# Patient Record
Sex: Female | Born: 1970 | Race: Black or African American | Hispanic: No | Smoking: Never smoker
Health system: Southern US, Community
[De-identification: ages and names within clinical notes are randomized; demographics above are authoritative.]

## PROBLEM LIST (undated history)

## (undated) DIAGNOSIS — E7800 Pure hypercholesterolemia, unspecified: Secondary | ICD-10-CM

## (undated) DIAGNOSIS — D649 Anemia, unspecified: Secondary | ICD-10-CM

## (undated) DIAGNOSIS — Z8639 Personal history of other endocrine, nutritional and metabolic disease: Secondary | ICD-10-CM

## (undated) DIAGNOSIS — B9689 Other specified bacterial agents as the cause of diseases classified elsewhere: Secondary | ICD-10-CM

## (undated) DIAGNOSIS — B373 Candidiasis of vulva and vagina: Secondary | ICD-10-CM

## (undated) DIAGNOSIS — Z87898 Personal history of other specified conditions: Secondary | ICD-10-CM

## (undated) DIAGNOSIS — R300 Dysuria: Secondary | ICD-10-CM

## (undated) DIAGNOSIS — I493 Ventricular premature depolarization: Secondary | ICD-10-CM

## (undated) DIAGNOSIS — N926 Irregular menstruation, unspecified: Secondary | ICD-10-CM

## (undated) DIAGNOSIS — K219 Gastro-esophageal reflux disease without esophagitis: Secondary | ICD-10-CM

## (undated) DIAGNOSIS — Z87448 Personal history of other diseases of urinary system: Secondary | ICD-10-CM

## (undated) DIAGNOSIS — D250 Submucous leiomyoma of uterus: Secondary | ICD-10-CM

## (undated) DIAGNOSIS — Z87442 Personal history of urinary calculi: Secondary | ICD-10-CM

## (undated) DIAGNOSIS — L700 Acne vulgaris: Secondary | ICD-10-CM

## (undated) DIAGNOSIS — Z803 Family history of malignant neoplasm of breast: Secondary | ICD-10-CM

## (undated) DIAGNOSIS — L659 Nonscarring hair loss, unspecified: Secondary | ICD-10-CM

## (undated) DIAGNOSIS — L309 Dermatitis, unspecified: Secondary | ICD-10-CM

## (undated) DIAGNOSIS — E876 Hypokalemia: Secondary | ICD-10-CM

## (undated) DIAGNOSIS — E039 Hypothyroidism, unspecified: Secondary | ICD-10-CM

## (undated) DIAGNOSIS — G4700 Insomnia, unspecified: Secondary | ICD-10-CM

## (undated) DIAGNOSIS — D509 Iron deficiency anemia, unspecified: Secondary | ICD-10-CM

## (undated) DIAGNOSIS — Z8742 Personal history of other diseases of the female genital tract: Secondary | ICD-10-CM

## (undated) DIAGNOSIS — N760 Acute vaginitis: Secondary | ICD-10-CM

## (undated) DIAGNOSIS — K449 Diaphragmatic hernia without obstruction or gangrene: Secondary | ICD-10-CM

## (undated) HISTORY — DX: Acute vaginitis: N76.0

## (undated) HISTORY — DX: Dermatitis, unspecified: L30.9

## (undated) HISTORY — DX: Submucous leiomyoma of uterus: D25.0

## (undated) HISTORY — DX: Other specified bacterial agents as the cause of diseases classified elsewhere: B96.89

## (undated) HISTORY — DX: Pure hypercholesterolemia, unspecified: E78.00

## (undated) HISTORY — DX: Family history of malignant neoplasm of breast: Z80.3

## (undated) HISTORY — DX: Anemia, unspecified: D64.9

## (undated) HISTORY — DX: Iron deficiency anemia, unspecified: D50.9

## (undated) HISTORY — DX: Personal history of other specified conditions: Z87.898

## (undated) HISTORY — PX: COLONOSCOPY: SHX174

## (undated) HISTORY — DX: Dysuria: R30.0

## (undated) HISTORY — DX: Insomnia, unspecified: G47.00

## (undated) HISTORY — DX: Irregular menstruation, unspecified: N92.6

## (undated) HISTORY — DX: Personal history of urinary calculi: Z87.442

## (undated) HISTORY — DX: Ventricular premature depolarization: I49.3

## (undated) HISTORY — DX: Gastro-esophageal reflux disease without esophagitis: K21.9

## (undated) HISTORY — DX: Nonscarring hair loss, unspecified: L65.9

## (undated) HISTORY — DX: Hypokalemia: E87.6

## (undated) HISTORY — DX: Personal history of other diseases of the female genital tract: Z87.42

## (undated) HISTORY — DX: Diaphragmatic hernia without obstruction or gangrene: K44.9

## (undated) HISTORY — DX: Personal history of other diseases of urinary system: Z87.448

## (undated) HISTORY — PX: WISDOM TOOTH EXTRACTION: SHX21

## (undated) HISTORY — DX: Acne vulgaris: L70.0

## (undated) HISTORY — DX: Candidiasis of vulva and vagina: B37.3

## (undated) HISTORY — DX: Hypothyroidism, unspecified: E03.9

## (undated) HISTORY — PX: DILATION AND CURETTAGE OF UTERUS: SHX78

## (undated) HISTORY — DX: Personal history of other endocrine, nutritional and metabolic disease: Z86.39

---

## 1998-03-25 ENCOUNTER — Other Ambulatory Visit: Admission: RE | Admit: 1998-03-25 | Discharge: 1998-03-25 | Payer: Self-pay | Admitting: Obstetrics and Gynecology

## 1998-10-22 ENCOUNTER — Emergency Department (HOSPITAL_COMMUNITY): Admission: EM | Admit: 1998-10-22 | Discharge: 1998-10-22 | Payer: Self-pay | Admitting: Emergency Medicine

## 1998-10-27 ENCOUNTER — Ambulatory Visit (HOSPITAL_COMMUNITY): Admission: RE | Admit: 1998-10-27 | Discharge: 1998-10-27 | Payer: Self-pay | Admitting: *Deleted

## 1998-10-27 ENCOUNTER — Encounter: Payer: Self-pay | Admitting: *Deleted

## 1998-11-18 ENCOUNTER — Ambulatory Visit (HOSPITAL_COMMUNITY): Admission: RE | Admit: 1998-11-18 | Discharge: 1998-11-18 | Payer: Self-pay | Admitting: Family Medicine

## 1998-11-18 ENCOUNTER — Encounter: Payer: Self-pay | Admitting: Family Medicine

## 1999-03-30 ENCOUNTER — Other Ambulatory Visit: Admission: RE | Admit: 1999-03-30 | Discharge: 1999-03-30 | Payer: Self-pay | Admitting: Obstetrics and Gynecology

## 2000-04-11 ENCOUNTER — Other Ambulatory Visit: Admission: RE | Admit: 2000-04-11 | Discharge: 2000-04-11 | Payer: Self-pay | Admitting: Obstetrics and Gynecology

## 2001-05-20 ENCOUNTER — Other Ambulatory Visit: Admission: RE | Admit: 2001-05-20 | Discharge: 2001-05-20 | Payer: Self-pay | Admitting: Obstetrics and Gynecology

## 2002-07-06 ENCOUNTER — Encounter: Payer: Self-pay | Admitting: Emergency Medicine

## 2002-07-06 ENCOUNTER — Emergency Department (HOSPITAL_COMMUNITY): Admission: EM | Admit: 2002-07-06 | Discharge: 2002-07-06 | Payer: Self-pay | Admitting: Emergency Medicine

## 2002-08-12 ENCOUNTER — Other Ambulatory Visit: Admission: RE | Admit: 2002-08-12 | Discharge: 2002-08-12 | Payer: Self-pay | Admitting: Obstetrics and Gynecology

## 2002-12-01 ENCOUNTER — Encounter: Admission: RE | Admit: 2002-12-01 | Discharge: 2002-12-01 | Payer: Self-pay | Admitting: Gastroenterology

## 2002-12-01 ENCOUNTER — Encounter: Payer: Self-pay | Admitting: Gastroenterology

## 2002-12-04 ENCOUNTER — Encounter: Payer: Self-pay | Admitting: Emergency Medicine

## 2002-12-04 ENCOUNTER — Emergency Department (HOSPITAL_COMMUNITY): Admission: EM | Admit: 2002-12-04 | Discharge: 2002-12-04 | Payer: Self-pay | Admitting: Emergency Medicine

## 2003-08-18 ENCOUNTER — Other Ambulatory Visit: Admission: RE | Admit: 2003-08-18 | Discharge: 2003-08-18 | Payer: Self-pay | Admitting: Obstetrics and Gynecology

## 2003-12-06 ENCOUNTER — Emergency Department (HOSPITAL_COMMUNITY): Admission: EM | Admit: 2003-12-06 | Discharge: 2003-12-06 | Payer: Self-pay | Admitting: Family Medicine

## 2004-03-31 ENCOUNTER — Ambulatory Visit (HOSPITAL_COMMUNITY): Admission: RE | Admit: 2004-03-31 | Discharge: 2004-03-31 | Payer: Self-pay | Admitting: Obstetrics and Gynecology

## 2004-03-31 ENCOUNTER — Encounter (INDEPENDENT_AMBULATORY_CARE_PROVIDER_SITE_OTHER): Payer: Self-pay | Admitting: *Deleted

## 2004-03-31 HISTORY — PX: HYSTEROSCOPY: SHX211

## 2004-07-25 ENCOUNTER — Other Ambulatory Visit: Admission: RE | Admit: 2004-07-25 | Discharge: 2004-07-25 | Payer: Self-pay | Admitting: Obstetrics and Gynecology

## 2004-11-11 ENCOUNTER — Encounter: Admission: RE | Admit: 2004-11-11 | Discharge: 2004-11-11 | Payer: Self-pay | Admitting: Gastroenterology

## 2005-06-29 ENCOUNTER — Other Ambulatory Visit: Admission: RE | Admit: 2005-06-29 | Discharge: 2005-06-29 | Payer: Self-pay | Admitting: Obstetrics and Gynecology

## 2005-12-12 ENCOUNTER — Encounter: Admission: RE | Admit: 2005-12-12 | Discharge: 2005-12-12 | Payer: Self-pay | Admitting: Obstetrics and Gynecology

## 2005-12-21 ENCOUNTER — Other Ambulatory Visit: Admission: RE | Admit: 2005-12-21 | Discharge: 2005-12-21 | Payer: Self-pay | Admitting: Obstetrics and Gynecology

## 2006-02-20 HISTORY — PX: ESOPHAGOGASTRODUODENOSCOPY: SHX1529

## 2006-05-15 ENCOUNTER — Encounter: Admission: RE | Admit: 2006-05-15 | Discharge: 2006-05-15 | Payer: Self-pay | Admitting: Obstetrics and Gynecology

## 2006-05-21 ENCOUNTER — Ambulatory Visit (HOSPITAL_COMMUNITY): Admission: RE | Admit: 2006-05-21 | Discharge: 2006-05-21 | Payer: Self-pay | Admitting: Obstetrics and Gynecology

## 2006-07-20 ENCOUNTER — Emergency Department (HOSPITAL_COMMUNITY): Admission: EM | Admit: 2006-07-20 | Discharge: 2006-07-21 | Payer: Self-pay | Admitting: Emergency Medicine

## 2006-08-02 ENCOUNTER — Encounter: Admission: RE | Admit: 2006-08-02 | Discharge: 2006-08-02 | Payer: Self-pay | Admitting: Gastroenterology

## 2007-01-05 ENCOUNTER — Emergency Department (HOSPITAL_COMMUNITY): Admission: EM | Admit: 2007-01-05 | Discharge: 2007-01-05 | Payer: Self-pay | Admitting: Family Medicine

## 2007-01-24 ENCOUNTER — Ambulatory Visit (HOSPITAL_COMMUNITY): Admission: RE | Admit: 2007-01-24 | Discharge: 2007-01-24 | Payer: Self-pay | Admitting: Obstetrics and Gynecology

## 2007-01-24 ENCOUNTER — Encounter (INDEPENDENT_AMBULATORY_CARE_PROVIDER_SITE_OTHER): Payer: Self-pay | Admitting: Obstetrics and Gynecology

## 2009-05-16 ENCOUNTER — Emergency Department (HOSPITAL_COMMUNITY): Admission: EM | Admit: 2009-05-16 | Discharge: 2009-05-16 | Payer: Self-pay | Admitting: Emergency Medicine

## 2010-03-12 ENCOUNTER — Encounter: Payer: Self-pay | Admitting: Gastroenterology

## 2010-05-16 LAB — URINALYSIS, ROUTINE W REFLEX MICROSCOPIC
Glucose, UA: NEGATIVE mg/dL
Ketones, ur: 15 mg/dL — AB
Protein, ur: 30 mg/dL — AB
Specific Gravity, Urine: 1.029 (ref 1.005–1.030)
Urobilinogen, UA: 1 mg/dL (ref 0.0–1.0)

## 2010-05-16 LAB — URINE MICROSCOPIC-ADD ON

## 2010-05-16 LAB — POCT I-STAT, CHEM 8
Chloride: 104 mEq/L (ref 96–112)
Creatinine, Ser: 0.8 mg/dL (ref 0.4–1.2)
HCT: 38 % (ref 36.0–46.0)
Potassium: 3.5 mEq/L (ref 3.5–5.1)
TCO2: 23 mmol/L (ref 0–100)

## 2010-07-05 NOTE — Op Note (Signed)
NAME:  Sally Mitchell, Sally Mitchell NO.:  [id number]   MEDICAL RECORD NO.:  [id number]          PATIENT TYPE:  AMB   LOCATION:  SDC                           FACILITY:  [facility]   PHYSICIAN:  Janine Limbo, M.D.DATE OF BIRTH:  02-19-1971   DATE OF PROCEDURE:  01/24/2007  DATE OF DISCHARGE:                               OPERATIVE REPORT   PREOPERATIVE DIAGNOSES:  1. Fibroid uterus.  2. Menorrhagia.  3. Anemia (hemoglobin 10.3).   POSTOPERATIVE DIAGNOSES:  1. Fibroid uterus.  2. Menorrhagia.  3. Anemia (hemoglobin 10.3).   PROCEDURE:  1. Hysteroscopy.  2. Hysteroscopic resection of a fibroid.  3. Hysteroscopic ablation of a fibroid.  4. Dilatation and curettage.   SURGEON:  Dr. Leonard Schwartz.   FIRST ASSISTANT:  None.   ANESTHETIC:  Was general.   DISPOSITION:  Ms. Eaglin is a 40 year old female, para 1-0-0-1, who  presents with the above-mentioned diagnosis.  Hydro sonogram showed a  large submucosal fibroid.  The patient wishes to maintain her  childbearing potential.  She understands the indications for her  surgical procedure, and she accepts the risks of, but not limited to,  anesthetic complications, bleeding, infection, and possible damage to  surrounding organs.   FINDINGS:  The patient was noted to have a 4 x 4-cm submucosal fibroid.  There was also a 1 x 1 cm submucosal fibroid.  The remainder of the  endometrial cavity appeared normal.  No adnexal masses were appreciated.   PROCEDURE:  The patient was taken to the operating room where a general  anesthetic was given.  The patient's abdomen, perineum, and vagina were  prepped with multiple layers of Betadine.  The bladder was drained of  urine.  Examination under anesthesia was performed.  The patient was  noted to have a fibroid uterus that was approximately 8 to 10 weeks  size.  No adnexal masses were appreciated.  The patient was then  sterilely draped.  A paracervical block was placed  using 10 mL of half  percent Marcaine with epinephrine.  An additional 10 mL of half percent  Marcaine with epinephrine were injected directly into the cervix.  An  endocervical curettage was obtained.  The uterus sounded to 9.5 cm.  The  cervix was gently dilated.  The hysteroscope was inserted, and the  cavity was carefully inspected.  Pictures were taken.  We then used the  VersaPoint instrument to resect portions of the submucosal fibroid.  Saline was the medium used to distend the endometrial cavity.  After a  generous portion of the fibroid had been resected, we then used the  VersaPoint to ablate the remainder of the fibroid.  The second fibroid  was easily ablated.  The cavity was then curetted until it was felt to  be clean.  The cavity was explored using Randall stone forceps.  We then  inserted the hysteroscope once again, and the cavity was indeed noted to  be clean.  Hemostasis was adequate.  We felt we were ready to end our  procedure.  All instruments were removed.  Examination under anesthesia  was repeated, and the uterus was noted to be firm.  Sponge, needle, and  instrument counts were correct on 2 occasions.  Estimated blood loss was  25 mL.  The estimated saline deficit was approximately 850 mL.  The  patient tolerated her procedure well.  She was awakened from her  anesthetic without difficulty and taken to the recovery room in stable  condition.   FOLLOWUP INSTRUCTIONS:  The patient was given Darvocet, and she will  take 1 to 2 tablets every 4 hours as needed for pain.  She will also  take Motrin 800 mg every 8 hours as needed for pain.  She will return to  see Dr. Stefano Gaul in 2-3 weeks for follow-up examination.  She was given  a copy of the postoperative instruction sheet as prepared by the [facility] for patients who have undergone a dilatation and  curettage.  She will call for questions or concerns.   PATHOLOGY SPECIMENS:  The  endocervical curettage and the endometrial  resections were sent to pathology for evaluation.      Janine Limbo, M.D.  Electronically Signed     AVS/MEDQ  D:  01/24/2007  T:  01/24/2007  Job:  [id number]

## 2010-07-05 NOTE — H&P (Signed)
NAME:  Sally Mitchell, PALACIOS NO.:  [id number]   MEDICAL RECORD NO.:  [id number]          PATIENT TYPE:  AMB   LOCATION:                                [facility]ILITY:  [facility]   PHYSICIAN:  Janine Limbo, M.D.DATE OF BIRTH:  03/03/1970   DATE OF ADMISSION:  01/24/2007  DATE OF DISCHARGE:                              HISTORY & PHYSICAL   HISTORY OF PRESENT ILLNESS:  [name] is a 40 year old female para  1-0-0-1 who presents for hysteroscopy with resection of a submucosal  fibroid.  The patient has been followed at [facility].  The patient complains of  irregular bleeding.  She has had anemia in the past.  The patient has a  past history of hypothyroidism.  The patient had hysteroscopic resection  with fibroid in 2005, but her heavy bleeding has now returned.  An  ultrasound showed an 8.8 x 6.7 cm uterus with multiple fibroids.  A  hydrosonogram showed a submucosal fibroid has returned.   OBSTETRICAL HISTORY:  The patient has had one term vaginal delivery.   PAST MEDICAL HISTORY:  The patient has a history of hypothyroidism and a  history of anemia.   She currently takes Synthroid each day.  She also has a history of  gastroesophageal reflux disease.  She takes Protonix.  She has seasonal  allergies for which she takes Zyrtec.   DRUG ALLERGIES:  DOXYCYCLINE, SULFA MEDICATIONS, and ERYTHROMYCIN.   SOCIAL HISTORY:  The patient denies cigarette use, alcohol use, and  recreational drug use.   REVIEW OF SYSTEMS:  Noncontributory.   FAMILY HISTORY:  Noncontributory.   PHYSICAL EXAMINATION:  HEENT:  Within normal limits.  CHEST:  Clear.  HEART:  Regular rate and rhythm.  BREASTS:  Without masses.  ABDOMEN:  Nontender.  EXTREMITIES:  Grossly normal.  NEUROLOGIC:  Grossly normal.  PELVIC:  External genitalia normal.  Vagina is normal.  Cervix is  nontender.  The uterus is 8-10-week size and irregular.  Adnexa  no  masses.  RECTOVAGINAL:  Confirms.   ASSESSMENT:  1. Irregular bleeding.  2. Anemia.  3. Submucosal fibroid.   PLAN:  The patient will undergo hysteroscopy with resection of the  fibroid.  She will also undergo a dilatation and curettage.  She  understands the indications for her surgical procedure, and she accepts  the risks of, but not limited to, anesthetic complications, bleeding,  infections, and possible damage to the surrounding organs.  The patient  is not ready to say that she does not ever want to have any more  children.      Janine Limbo, M.D.  Electronically Signed     AVS/MEDQ  D:  01/03/2007  T:  01/04/2007  Job:  [id number]

## 2010-07-08 NOTE — Op Note (Signed)
NAME:  Sally Mitchell, Sally Mitchell NO.:  [id number]   MEDICAL RECORD NO.:  [id number]          PATIENT TYPE:  AMB   LOCATION:  SDC                           [facility]ILITY:  [facility]   PHYSICIAN:  Janine Limbo, M.D.DATE OF BIRTH:  06-05-1970   DATE OF PROCEDURE:  03/31/2004  DATE OF DISCHARGE:                                 OPERATIVE REPORT   PREOPERATIVE DIAGNOSES:  1.  Menorrhagia.  2.  Anemia (hemoglobin 11.9).  3.  Fibroid uterus.   POSTOPERATIVE DIAGNOSES:  1.  Menorrhagia.  2.  Anemia (hemoglobin 11.9).  3.  Fibroid uterus.   PROCEDURES:  1.  Hysteroscopy with resection of endometrial fibroid.  2.  Dilatation and curettage.   SURGEON:  Janine Limbo, M.D.   ASSISTANT:  None.   ANESTHETIC:  Monitored anesthetic control, paracervical block using 0.5%  Marcaine with epinephrine.   DISPOSITION:  Ms. Schnepf is a 40 year old female para 1-0-0-1 who  presents with the above-mentioned diagnoses.  Her hemoglobin has been as low  as 8.4.  The patient had a hydrosonogram performed which showed an  endometrial fibroid that was submucosal in location.  The patient  understands the indications for her procedure and she accepts the risks of  but not limited to anesthetic complications, bleeding, infection, and  possible damage to surrounding organs.   FINDINGS:  The uterus was 10-12 weeks size and irregular.  On hysteroscopy,  the patient was found to have a submucosal fibroid on the left lateral wall  that measured approximately 1.5 cm in size.  No pathologic lesions were  noted other than the fibroid mentioned above.  No adnexal masses were  appreciated on examination under anesthesia.   PROCEDURE IN DETAIL:  The patient was taken to the operating room where she  was given medication through her IV line.  The patient's perineum and vagina  were prepped with multiple layers of Betadine.  Examination under anesthesia  was performed.  A Foley catheter was placed in  the bladder.  A paracervical  block was then placed using 10 mL of 0.5% Marcaine with epinephrine.  An  endocervical curettage was performed.  The uterus sounded to 12 cm.  The  cervix was dilated minimally.  The diagnostic hysteroscope was inserted and  the endometrial cavity was inspected.  Findings are mentioned above.  Pictures were taken of the patient's endometrial cavity.  The diagnostic  hysteroscope was removed and the cervix was dilated further.  The operative  hysteroscope was then inserted.  Using the single loop resection tool, we  were then able to resect the fibroid mentioned above.  Care was taken not to  damage the uterus beyond its normal cavity.  The cavity was then curetted  until it was felt to be completely clean.  Hemostasis was noted to be  adequate.  At this point we felt that we had completed our procedure.  All  instruments were removed.  Again, hemostasis was adequate.  Examination  under anesthesia was repeated and the uterus was noted to be firm.  The  patient was awakened from her anesthetic  and taken to recovery room in  stable condition.  The estimated blood loss was 20 mL.  The estimated  sorbitol deficit was 120 mL as registered on our instrument but we did note  that quite a bit of the sorbitol was spilled on the operating room floor.   FOLLOW-UP INSTRUCTIONS:  The patient was given a prescription for Vicodin  and she will take one or two every 4 hours as needed for pain.  She will  return to see Dr. Stefano Gaul in 2-3 weeks for follow-up examination.  The  patient was given a copy of the postoperative instruction sheet as prepared  by the [facility] for patient's who have undergone a  dilatation and curettage.      AVS/MEDQ  D:  03/31/2004  T:  03/31/2004  Job:  [id number]

## 2010-07-08 NOTE — H&P (Signed)
NAME:  Sally Mitchell, Sally Mitchell NO.:  [id number]   MEDICAL RECORD NO.:  [id number]           PATIENT TYPE:   LOCATION:                                 FACILITY:   PHYSICIAN:  Janine Limbo, M.D.    DATE OF BIRTH:   DATE OF ADMISSION:  03/31/2004  DATE OF DISCHARGE:                                HISTORY & PHYSICAL   HISTORY OF PRESENT ILLNESS:  Ms. Colantuono is a 40 year old female, para 1-  0-0-1, who presents for a hysteroscopy with resection of a submucosal  fibroid.  The patient has a history of heavy menstrual cycles, and her  hemoglobin has been as low as 8.4.  She takes iron on  a regular basis.  The  patient had a sonohistogram performed that showed a 9.7-cm x 6.6-cm uterus  with multiple fibroids.  A 1.4-cm submucosal fibroid was noted.  The  patient's most recent Pap smear was within normal limits.   OBSTETRICAL HISTORY:  The patient has had one term vaginal delivery.   PAST MEDICAL HISTORY:  1.  Hypothyroidism.  2.  Anemia.   CURRENT MEDICATIONS:  Ortho Evra patch, Hemocyte, and Synthroid.   DRUG ALLERGIES:  1.  ERYTHROMYCIN.  2.  SULFA MEDICATIONS.  3.  DOXYCYCLINE.   REVIEW OF SYSTEMS:  Noncontributory.   SOCIAL HISTORY:  The patient denies cigarette use and recreational drug use.   FAMILY HISTORY:  The patient's father had a myocardial infarction, and he  also died from multiple myeloma.  The patient's mother had a stroke.  The  patient's sister had ovarian cancer.   PHYSICAL EXAMINATION:  VITAL SIGNS:  Weight is 133 pounds.  HEENT:  Within normal limits.  CHEST:  Clear.  HEART:  Regular rate and rhythm.  BREASTS:  Without masses.  ABDOMEN:  Nontender.  EXTREMITIES:  Within normal limits.  NEUROLOGIC:  Grossly normal.  PELVIC:  External genitalia is normal.  Vagina is normal.  Cervix is  nontender.  Uterus is 8-10 weeks size, irregular, and firm.  Adnexa:  No  masses.  Rectovaginal exam confirms.   LABORATORY VALUES:  Hemoglobin is  11.9.  TSH is 3.186.   ASSESSMENT:  1.  Menometrorrhagia.  2.  Anemia.  3.  Submucosal fibroid and fibroid uterus.  4.  Hypothyroidism.   PLAN:  The patient will undergo a hysteroscopy with dilatation and  curettage.  She understands the indications for the procedure, and she  accepts the risk of, but not limited to, anesthetic complications, bleeding,  infection, and possible damage to the surrounding organs.      AVS/MEDQ  D:  03/30/2004  T:  03/30/2004  Job:  [id number]

## 2010-07-08 NOTE — H&P (Signed)
NAME:  Sally Mitchell, FULGHAM NO.:  [id number]   MEDICAL RECORD NO.:  [id number]         PATIENT TYPE:  WAMB   LOCATION:                                [facility]ILITY:  [facility]   PHYSICIAN:  Janine Limbo, M.D.DATE OF BIRTH:  [birth year]   DATE OF ADMISSION:  11/12/2003  DATE OF DISCHARGE:                                HISTORY & PHYSICAL   HISTORY OF PRESENT ILLNESS:  Sally Mitchell is a 40 year old female, para 1-  0-0-1, who presents for hysteroscopy with resection of a submucosal fibroid.  The patient has a history of heavy bleeding.  Her hemoglobin has been as low  as 8.4.  She does take iron.  The patient also has a history of  hypothyroidism, and she currently takes Synthroid.  The patient had a  sonohistogram performed that showed a 9.7 cm x 6.6 cm uterus with multiple  fibroids (less than 2.9 cm in size).  The patient was noted to have a  submucosal fibroid that measured approximately 1.4 cm in size.  The  endometrium measured 0.58 cm.  The patient had a Pap smear that was within  normal limits.   PAST MEDICAL HISTORY:  The patient has hypothyroidism and anemia, as  mentioned above.   OBSTETRICAL HISTORY:  The patient has had one term vaginal delivery.   DRUG ALLERGIES:  1.  DOXYCYCLINE.  2.  SEPTRA.   REVIEW OF SYSTEMS:  Noncontributory.   SOCIAL HISTORY:  The patient denies cigarette use and recreational drug use.   FAMILY HISTORY:  Noncontributory.   PHYSICAL EXAMINATION:  VITAL SIGNS:  Weight is 134 pounds.  HEENT:  Within normal limits.  CHEST:  Clear.  HEART:  Regular rate and rhythm.  BREASTS:  Without masses.  ABDOMEN:  Nontender.  EXTREMITIES:  Within normal limits.  NEUROLOGIC:  Grossly normal.  PELVIC:  External genitalia is normal.  The vagina is normal.  Cervix is  nontender.  The uterus is 8-10 weeks size.  Adnexa - no masses.  RECTOVAGINAL:  Confirms.   ASSESSMENT:  1.  Menometrorrhagia.  2.  Anemia.  3.  Fibroid uterus with submucosal  fibroids.   PLAN:  The patient will undergo hysteroscopy with resection of a fibroid.  She understands the indications for her procedure, and she accepts the risk  of, but not limited to, anesthetic complications, bleeding, infections, and  possible damage to the surrounding organs.      AVS/MEDQ  D:  11/10/2003  T:  11/10/2003  Job:  [id number]

## 2010-11-28 LAB — URINALYSIS, ROUTINE W REFLEX MICROSCOPIC
Bilirubin Urine: NEGATIVE
Glucose, UA: NEGATIVE
Ketones, ur: NEGATIVE
Protein, ur: NEGATIVE
Specific Gravity, Urine: >1.03 — ABNORMAL HIGH
Urobilinogen, UA: 0.2
pH: 5.5

## 2010-11-28 LAB — CBC
Hemoglobin: 10.3 — ABNORMAL LOW
MCHC: 33.9

## 2010-11-28 LAB — BASIC METABOLIC PANEL
BUN: 8
CO2: 26
Calcium: 8.7
Chloride: 105
GFR calc Af Amer: >60
Glucose, Bld: 89
Potassium: 3.2 — ABNORMAL LOW
Sodium: 136

## 2010-11-28 LAB — URINE MICROSCOPIC-ADD ON

## 2010-11-28 LAB — PREGNANCY, URINE: Preg Test, Ur: NEGATIVE

## 2011-05-23 ENCOUNTER — Ambulatory Visit (INDEPENDENT_AMBULATORY_CARE_PROVIDER_SITE_OTHER): Payer: PRIVATE HEALTH INSURANCE | Admitting: Obstetrics and Gynecology

## 2011-05-23 DIAGNOSIS — R5383 Other fatigue: Secondary | ICD-10-CM

## 2011-05-23 DIAGNOSIS — D259 Leiomyoma of uterus, unspecified: Secondary | ICD-10-CM

## 2011-05-23 DIAGNOSIS — R5381 Other malaise: Secondary | ICD-10-CM

## 2011-05-23 DIAGNOSIS — R350 Frequency of micturition: Secondary | ICD-10-CM

## 2011-05-23 DIAGNOSIS — Z01419 Encounter for gynecological examination (general) (routine) without abnormal findings: Secondary | ICD-10-CM

## 2011-06-20 ENCOUNTER — Telehealth: Payer: Self-pay | Admitting: Obstetrics and Gynecology

## 2011-06-20 NOTE — Telephone Encounter (Signed)
Tc FROM pt questioning if needs full bladder for U/S 06/21/11.  VM left for pt that she does not.  To call with further questions.

## 2011-06-21 ENCOUNTER — Other Ambulatory Visit: Payer: Self-pay | Admitting: Obstetrics and Gynecology

## 2011-06-21 ENCOUNTER — Ambulatory Visit (INDEPENDENT_AMBULATORY_CARE_PROVIDER_SITE_OTHER): Payer: PRIVATE HEALTH INSURANCE | Admitting: Obstetrics and Gynecology

## 2011-06-21 ENCOUNTER — Ambulatory Visit (INDEPENDENT_AMBULATORY_CARE_PROVIDER_SITE_OTHER): Payer: PRIVATE HEALTH INSURANCE

## 2011-06-21 ENCOUNTER — Other Ambulatory Visit: Payer: PRIVATE HEALTH INSURANCE

## 2011-06-21 ENCOUNTER — Encounter: Payer: Self-pay | Admitting: Obstetrics and Gynecology

## 2011-06-21 VITALS — BP 98/56 | HR 60 | Wt 144.0 lb

## 2011-06-21 DIAGNOSIS — E039 Hypothyroidism, unspecified: Secondary | ICD-10-CM

## 2011-06-21 DIAGNOSIS — F4329 Adjustment disorder with other symptoms: Secondary | ICD-10-CM | POA: Insufficient documentation

## 2011-06-21 DIAGNOSIS — E785 Hyperlipidemia, unspecified: Secondary | ICD-10-CM

## 2011-06-21 DIAGNOSIS — D259 Leiomyoma of uterus, unspecified: Secondary | ICD-10-CM

## 2011-06-21 DIAGNOSIS — E7800 Pure hypercholesterolemia, unspecified: Secondary | ICD-10-CM | POA: Insufficient documentation

## 2011-06-21 DIAGNOSIS — D219 Benign neoplasm of connective and other soft tissue, unspecified: Secondary | ICD-10-CM

## 2011-06-21 DIAGNOSIS — R6889 Other general symptoms and signs: Secondary | ICD-10-CM

## 2011-06-21 DIAGNOSIS — R196 Halitosis: Secondary | ICD-10-CM

## 2011-06-21 DIAGNOSIS — F329 Major depressive disorder, single episode, unspecified: Secondary | ICD-10-CM

## 2011-06-21 DIAGNOSIS — R109 Unspecified abdominal pain: Secondary | ICD-10-CM | POA: Insufficient documentation

## 2011-06-21 NOTE — Progress Notes (Signed)
[name] is a 41 y.o. year old female,No obstetric history on file., who presents for a problem visit.  Subjective:  The patient continues to have vague lower abdominal pain.  She has a past history of fibroids.  Her husband died 1 year ago this month and she continues to grief.  She has a known history of elevated cholesterol.  Objective:  BP 98/56  Pulse 60  Wt 144 lb (65.318 kg)  LMP 06/01/2011   General: the patient appears lead but she is able to smile during our discussions and she talks openly about her feelings.  Exam deferred.  Total cholesterol is 201.  LDL cholesterol is 124.  Ultrasound: The uterus measures 10.2 cm x 7.0 cm.  The endometrium measures 0.5 cm.  The ovaries appeared normal.  3 fibroids were noted and the largest subserosal fibroid measures 5.1 cm.  Assessment:  Fibroid uterus Date abdominal pain Prolonged grief reaction Elevated cholesterol values  Plan:  The medical and surgical management of fibroids was reviewed.  The patient elects to observe only for now.  She will take Advil as needed.  We discussed the patient's grief feelings.  The patient will improve her diet and exercise regularly to reduce her cholesterol values.  Return to office prn if symptoms worsen or fail to improve.   Leonard Schwartz M.D.  06/21/2011 12:05 PM

## 2011-07-17 ENCOUNTER — Encounter: Payer: Self-pay | Admitting: Obstetrics and Gynecology

## 2011-07-17 NOTE — Progress Notes (Signed)
Ms. Basha Krygier Almanzar is a 41 y.o. year old female,No obstetric history on file., who presents for a problem visit. She has known fibroids.  She has abdominal pain of uncertain etiology.  She continues to grieve the death of her husband. Subjective:  Her pain continues but she does not know what makes it worse.  Counseling helps.  Objective:  There were no vitals taken for this visit.   General: alert, cooperative and she does appear sad.  Exam deferred.  Ultrasound: Uterus 10.2 x 7.0 cm, endometrium 0.53 cm, 3 fibroids noted (largest 5.1 cm), normal ovaries  Assessment:  Stable fibroids  Grief Reaction  Abdominal pain  Plan:  Observe only for now.  Continue counseling.  Return to office prn if symptoms worsen or fail to improve.   Leonard Schwartz M.D.  07/17/2011 5:28 PM

## 2011-10-05 ENCOUNTER — Ambulatory Visit (INDEPENDENT_AMBULATORY_CARE_PROVIDER_SITE_OTHER): Payer: PRIVATE HEALTH INSURANCE | Admitting: Family Medicine

## 2011-10-05 VITALS — BP 110/71 | HR 73 | Temp 98.3°F | Resp 16 | Ht 64.0 in | Wt 139.0 lb

## 2011-10-05 DIAGNOSIS — J029 Acute pharyngitis, unspecified: Secondary | ICD-10-CM

## 2011-10-05 DIAGNOSIS — R5383 Other fatigue: Secondary | ICD-10-CM

## 2011-10-05 DIAGNOSIS — E039 Hypothyroidism, unspecified: Secondary | ICD-10-CM

## 2011-10-05 DIAGNOSIS — R5381 Other malaise: Secondary | ICD-10-CM

## 2011-10-05 LAB — POCT RAPID STREP A (OFFICE): Rapid Strep A Screen: NEGATIVE

## 2011-10-05 LAB — POCT CBC
Granulocyte percent: 49.6 %G (ref 37–80)
Hemoglobin: 12.8 g/dL (ref 12.2–16.2)
MCH, POC: 30.4 pg (ref 27–31.2)
MCV: 96.8 fL (ref 80–97)
MPV: 10.5 fL (ref 0–99.8)
POC Granulocyte: 2.8 (ref 2–6.9)
POC MID %: 6.7 %M (ref 0–12)
Platelet Count, POC: 205 10*3/uL (ref 142–424)
RBC: 4.21 M/uL (ref 4.04–5.48)
RDW, POC: 13.3 %

## 2011-10-05 NOTE — Progress Notes (Signed)
  Subjective:    Patient ID: Sally Mitchell, female    DOB: 1971-02-19, 41 y.o.   MRN: [id number]  HPI Sally Mitchell is a 41 y.o. female Started few months ago - had sore throat - spots on tonsils, subjective fever then.  Took amox twice per day for 10 days.  Improved for a while, then on and off symptoms of sore throat on and off and redness in throat, worse past few days.  Feel subjectively warm at times past few days.   Has had white/yellow spots past 2 days.   No known sick contacts.   Fatigued past few months. TSH normal in February, husband passed last April.   Primary Dr. Tiburcio Pea.   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    Review of Systems  Constitutional: Positive for fever (subjective) and fatigue.  HENT: Positive for sore throat. Negative for neck pain.   Hematological: Negative for adenopathy.       Objective:   Physical Exam  Constitutional: She is oriented to person, place, and time. She appears well-developed and well-nourished. No distress.  HENT:  Head: Normocephalic and atraumatic.  Right Ear: External ear normal.  Left Ear: External ear normal.  Mouth/Throat: Posterior oropharyngeal erythema present.    Cardiovascular: Normal rate, regular rhythm, normal heart sounds and intact distal pulses.   Pulmonary/Chest: Effort normal and breath sounds normal.  Lymphadenopathy:    She has cervical adenopathy (min ttp L post AC, slightly enlarged. ).  Neurological: She is alert and oriented to person, place, and time.  Skin: She is not diaphoretic. There is erythema.  Psychiatric: She has a normal mood and affect. Her behavior is normal.    Results for orders placed in visit on 10/05/11  POCT CBC      Component Value Range   WBC 5.7  4.6 - 10.2 K/uL   Lymph, poc 2.5  0.6 - 3.4   POC LYMPH PERCENT 43.7  10 - 50 %L   MID (cbc) 0.4  0 - 0.9   POC MID % 6.7  0 - 12 %M   POC Granulocyte 2.8  2 - 6.9   Granulocyte percent 49.6  37 - 80 %G   RBC 4.21  4.04 - 5.48 M/uL   Hemoglobin 12.8  12.2 - 16.2 g/dL   HCT, POC 40.9  81.1 - 47.9 %   MCV 96.8  80 - 97 fL   MCH, POC 30.4  27 - 31.2 pg   MCHC 31.4 (*) 31.8 - 35.4 g/dL   RDW, POC 91.4     Platelet Count, POC 205  142 - 424 K/uL   MPV 10.5  0 - 99.8 fL  POCT RAPID STREP A (OFFICE)      Component Value Range   Rapid Strep A Screen Negative  Negative       Assessment & Plan:  Sally Mitchell is a 41 y.o. female 1. Fatigue  POCT CBC  2. Hypothyroid  TSH  3. Sore throat  POCT rapid strep A, Culture, Group A Strep   Reassuring CBC.  ddx viral (EBV, CMV) with fatigue, vs false negative strep.  Check EBV Igm, CMV IGM, throat cx, TSh for fatigue as hx of hypothyroidism.  rtc precautions. Sx care for throat - tylenol or motrin prn.

## 2011-10-05 NOTE — Patient Instructions (Signed)
Your should receive a call or letter about your lab results within the next week to 10 days.  Return to the clinic or go to the nearest emergency room if any of your symptoms worsen or new symptoms occur. Pharyngitis, Viral and Bacterial Pharyngitis is soreness (inflammation) or infection of the pharynx. It is also called a sore throat. CAUSES  Most sore throats are caused by viruses and are part of a cold. However, some sore throats are caused by strep and other bacteria. Sore throats can also be caused by post nasal drip from draining sinuses, allergies and sometimes from sleeping with an open mouth. Infectious sore throats can be spread from person to person by coughing, sneezing and sharing cups or eating utensils. TREATMENT  Sore throats that are viral usually last 3-4 days. Viral illness will get better without medications (antibiotics). Strep throat and other bacterial infections will usually begin to get better about 24-48 hours after you begin to take antibiotics. HOME CARE INSTRUCTIONS   If the caregiver feels there is a bacterial infection or if there is a positive strep test, they will prescribe an antibiotic. The full course of antibiotics must be taken. If the full course of antibiotic is not taken, you or your child may become ill again. If you or your child has strep throat and do not finish all of the medication, serious heart or kidney diseases may develop.   Drink enough water and fluids to keep your urine clear or pale yellow.   Only take over-the-counter or prescription medicines for pain, discomfort or fever as directed by your caregiver.   Get lots of rest.   Gargle with salt water ( tsp. of salt in a glass of water) as often as every 1-2 hours as you need for comfort.   Hard candies may soothe the throat if individual is not at risk for choking. Throat sprays or lozenges may also be used.  SEEK MEDICAL CARE IF:   Large, tender lumps in the neck develop.   A rash  develops.   Green, yellow-brown or bloody sputum is coughed up.   Your baby is older than 3 months with a rectal temperature of 100.5 F (38.1 C) or higher for more than 1 day.  SEEK IMMEDIATE MEDICAL CARE IF:   A stiff neck develops.   You or your child are drooling or unable to swallow liquids.   You or your child are vomiting, unable to keep medications or liquids down.   You or your child has severe pain, unrelieved with recommended medications.   You or your child are having difficulty breathing (not due to stuffy nose).   You or your child are unable to fully open your mouth.   You or your child develop redness, swelling, or severe pain anywhere on the neck.   You have a fever.   Your baby is older than 3 months with a rectal temperature of 102 F (38.9 C) or higher.   Your baby is 18 months old or younger with a rectal temperature of 100.4 F (38 C) or higher.  MAKE SURE YOU:   Understand these instructions.   Will watch your condition.   Will get help right away if you are not doing well or get worse.  Document Released: 02/06/2005 Document Revised: 01/26/2011 Document Reviewed: 05/06/2007 [facility] Patient Information 2012 [place], [place].

## 2011-10-08 LAB — CULTURE, GROUP A STREP

## 2011-10-09 ENCOUNTER — Telehealth: Payer: Self-pay

## 2011-10-09 LAB — EPSTEIN-BARR VIRUS VCA, IGM: EBV VCA IgM: <10 U/mL (ref <36.0)

## 2011-10-09 LAB — CMV IGM: CMV IgM: 0.13 (ref <0.90)

## 2011-10-09 NOTE — Telephone Encounter (Signed)
Pt called back about lab results. Went over results that are complete and instr's and mailed her copy of labs thus far. Advised pt that we are still waiting on some results. Pt agreed.

## 2011-11-17 ENCOUNTER — Encounter: Payer: Self-pay | Admitting: Internal Medicine

## 2011-12-22 ENCOUNTER — Ambulatory Visit: Payer: Self-pay | Admitting: Internal Medicine

## 2012-01-12 ENCOUNTER — Ambulatory Visit (INDEPENDENT_AMBULATORY_CARE_PROVIDER_SITE_OTHER): Payer: No Typology Code available for payment source | Admitting: Internal Medicine

## 2012-01-12 ENCOUNTER — Encounter: Payer: Self-pay | Admitting: Internal Medicine

## 2012-01-12 VITALS — BP 120/64 | HR 77 | Ht 64.0 in | Wt 142.0 lb

## 2012-01-12 DIAGNOSIS — R196 Halitosis: Secondary | ICD-10-CM

## 2012-01-12 DIAGNOSIS — R1012 Left upper quadrant pain: Secondary | ICD-10-CM | POA: Insufficient documentation

## 2012-01-12 DIAGNOSIS — R6889 Other general symptoms and signs: Secondary | ICD-10-CM

## 2012-01-12 NOTE — Progress Notes (Signed)
[facility] Subjective:    Patient ID: Sally Mitchell, female    DOB: 10-28-1970, 41 y.o.   MRN: [id number]  HPI Very pleasant woman with recent and prior abdominal pain problems. 2008 EGD [facility]) was unrevealing. Had upper abdominal pain not totally relieved by PPI or antispasmodics. More recently had sharp LUQ pain and nausea vomiting as well as diarrhea. Saw Dr. Tiburcio Pea - prescribed carafate. Had taken for several weeks and feels ok. Prior to that had been evaluated for sore throat. Negative exam. Negative strep and viral testing. CBC ok. Has also noticed bad breatj in past few months. No significant gas, bloating or abdominal pain. Does have some pelvic pain from fibroids.  She lost her husband in past year - he died from complications of autoimmune liver disease and she admits to grief and stress reaction.  Medications, allergies, past medical history, past surgical history, family history and social history are reviewed and updated in the EMR.  Review of Systems + menstrual pain, fatigue, sore throat    Objective:   Physical Exam General:  NAD Eyes:   anicteric Lungs:  clear Heart:  S1S2 no rubs, murmurs or gallops Abdomen:  soft and nontender, BS+ Ext:   no edema ENT:  Mild halitosis, multiple fillings but teeth and oral cavity o/w normal    Data ReviewedDeboraha Sprang PCP notes, labs 2013 and 2012, 2008 EGD, GI notes     Assessment & Plan:   1. LUQ pain  - resolved - suspect functional vs, gastroenteritis  2. Halitosis - most likely oral in origin, nasal/sinus next most likely   1. Observe off carafate 2. Be sure oral care is good, tongue brushing, try mouthwash 3. See dentist as planned in Jan 2014 4. See me prn  I appreciate the opportunity to care for this patient.  WU:[id number], Chrissie Noa, MD

## 2012-01-12 NOTE — Patient Instructions (Addendum)
Follow the oral hygiene suggestions as we discussed.  Follow up as needed.  Thank you for choosing me and  Gastroenterology.  Iva Boop, M.D., [facility]

## 2012-04-16 ENCOUNTER — Other Ambulatory Visit: Payer: Self-pay | Admitting: Obstetrics and Gynecology

## 2012-04-16 DIAGNOSIS — Z1231 Encounter for screening mammogram for malignant neoplasm of breast: Secondary | ICD-10-CM

## 2012-05-14 ENCOUNTER — Ambulatory Visit: Payer: No Typology Code available for payment source

## 2012-12-02 ENCOUNTER — Ambulatory Visit
Admission: RE | Admit: 2012-12-02 | Discharge: 2012-12-02 | Disposition: A | Discharge status: Home / Self Care | Payer: BC Managed Care – PPO | Source: Ambulatory Visit | Attending: Obstetrics and Gynecology | Admitting: Obstetrics and Gynecology

## 2012-12-02 DIAGNOSIS — Z1231 Encounter for screening mammogram for malignant neoplasm of breast: Secondary | ICD-10-CM

## 2013-04-01 ENCOUNTER — Ambulatory Visit: Payer: BC Managed Care – PPO

## 2013-04-01 ENCOUNTER — Ambulatory Visit (INDEPENDENT_AMBULATORY_CARE_PROVIDER_SITE_OTHER): Payer: BC Managed Care – PPO | Admitting: Internal Medicine

## 2013-04-01 VITALS — BP 132/84 | HR 86 | Temp 98.1°F | Resp 18 | Ht 63.5 in | Wt 139.0 lb

## 2013-04-01 DIAGNOSIS — M546 Pain in thoracic spine: Secondary | ICD-10-CM

## 2013-04-01 DIAGNOSIS — R079 Chest pain, unspecified: Secondary | ICD-10-CM

## 2013-04-01 LAB — POCT CBC
Granulocyte percent: 44.9 %G (ref 37–80)
HCT, POC: 42.2 % (ref 37.7–47.9)
HEMOGLOBIN: 13.2 g/dL (ref 12.2–16.2)
LYMPH, POC: 2.5 (ref 0.6–3.4)
MCH, POC: 30.6 pg (ref 27–31.2)
MCHC: 31.3 g/dL — AB (ref 31.8–35.4)
MCV: 98 fL — AB (ref 80–97)
MID (cbc): 0.3 (ref 0–0.9)
MPV: 10.3 fL (ref 0–99.8)
POC Granulocyte: 2.3 (ref 2–6.9)
POC LYMPH PERCENT: 49.6 %L (ref 10–50)
POC MID %: 5.5 %M (ref 0–12)
Platelet Count, POC: 199 10*3/uL (ref 142–424)
RBC: 4.31 M/uL (ref 4.04–5.48)
RDW, POC: 13.5 %
WBC: 5.1 10*3/uL (ref 4.6–10.2)

## 2013-04-01 NOTE — Progress Notes (Addendum)
Subjective:    Patient ID: Sally Mitchell, female    DOB: 14-Feb-1971, 43 y.o.   MRN: [id number] This chart was scribed for Tami Lin, MD  by Rolanda Lundborg, ED Scribe. This patient was seen in room 6 and the patient's care was started at 6:31 PM.  Chief Complaint  Patient presents with  . Chest Pain  . Palpitations  . Fatigue    HPI HPI Comments: Sally Mitchell is a 43 y.o. female who presents to the [facility] complaining of stabbing CP localized at the lower left scapula onset yesterday with palpitations onset this morning. She states her heart "just does not feel right". She denies that it worsens with deep breath, twisting, or moving. Walking to work did not make the pain worse but it made her a little SOB. She reports mild SOB recently. She reports feeling pressure in her chest when she bends over. She is still having the discomfort behind the scapula. No Htn or ht problems in past.   Her dad had an MI at age 48. She denies heartburn or indigestion.  Past Medical History  Diagnosis Date  . Bacterial vaginitis   . H/O menorrhagia   . H/O dysmenorrhea   . Candida vaginitis   . Dysuria   . Irregular menses   . Anemia   . Fibroids, submucosal   . FH: breast cancer   . H/O hematuria   . Hair loss   . H/O fatigue   . History of kidney stones   . Hypothyroidism   . H/O hyperthyroidism   . GERD (gastroesophageal reflux disease)   . Hypercholesterolemia     mild  . Eczema     seborrhecic dermatitis  . Hypopotassemia   . Insomnia   . Iron deficiency anemia   . Hiatal hernia    Current Outpatient Prescriptions on File Prior to Visit  Medication Sig Dispense Refill  . cetirizine (ZYRTEC) 10 MG tablet Take 10 mg by mouth as needed.       Marland Kitchen levothyroxine (SYNTHROID, LEVOTHROID) 112 MCG tablet Take 112 mcg by mouth daily.       No current facility-administered medications on file prior to visit.   Allergies  Allergen Reactions  . Doxycycline  Other (See Comments)    Pt get stomach cramps  . E-Mycin [Erythromycin Base]   . Septra [Bactrim]   . Sulfa Antibiotics Other (See Comments)    Pt throat closes up   She works in [employer]   Review of Systems  Constitutional: Negative for fever, diaphoresis and appetite change.  HENT: Negative for sore throat.   Respiratory: Negative for cough.   Gastrointestinal: Negative for nausea, diarrhea and constipation.  Musculoskeletal: Negative for back pain.   she has no genitourinary complaints There are no neurological complaints She has had no menstrual problems     Objective:   Physical Exam  Nursing note and vitals reviewed. Constitutional: She is oriented to person, place, and time. She appears well-developed and well-nourished. No distress.  HENT:  Head: Normocephalic and atraumatic.  Mouth/Throat: Oropharynx is clear and moist.  Eyes: Conjunctivae and EOM are normal. Pupils are equal, round, and reactive to light.  Neck: Normal range of motion. Neck supple. No thyromegaly present.  Cardiovascular: Normal rate, regular rhythm, normal heart sounds and intact distal pulses.   No murmur heard. Mid cystolic click which moves with respiration/ it is not associated with a murmur//left sternal border  Pulmonary/Chest: Effort normal  and breath sounds normal. No respiratory distress. She has no wheezes. She has no rales.  Abdominal: Soft. Bowel sounds are normal. She exhibits no mass. There is no tenderness.  Musculoskeletal: Normal range of motion. She exhibits no edema.  Marked tenderness along the lower left scapular border to palpation. Neck flexion reproduces pain there.  Lymphadenopathy:    She has no cervical adenopathy.  Neurological: She is alert and oriented to person, place, and time. No cranial nerve deficit.  Skin: Skin is warm and dry. She is not diaphoretic.  Psychiatric: She has a normal mood and affect. Her behavior is normal.    [facility] reading (PRIMARY) by  Tami Lin, MD  =NAD  Filed Vitals:   04/01/13 1820  BP: 132/84  Pulse: 86  Temp: 98.1 F (36.7 C)  TempSrc: Oral  Resp: 18  Height: 5' 3.5" (1.613 m)  Weight: 139 lb (63.05 kg)  SpO2: 98%      Results for orders placed in visit on 04/01/13  POCT CBC      Result Value Range   WBC 5.1  4.6 - 10.2 K/uL   Lymph, poc 2.5  0.6 - 3.4   POC LYMPH PERCENT 49.6  10 - 50 %L   MID (cbc) 0.3  0 - 0.9   POC MID % 5.5  0 - 12 %M   POC Granulocyte 2.3  2 - 6.9   Granulocyte percent 44.9  37 - 80 %G   RBC 4.31  4.04 - 5.48 M/uL   Hemoglobin 13.2  12.2 - 16.2 g/dL   HCT, POC 42.2  37.7 - 47.9 %   MCV 98.0 (*) 80 - 97 fL   MCH, POC 30.6  27 - 31.2 pg   MCHC 31.3 (*) 31.8 - 35.4 g/dL   RDW, POC 13.5     Platelet Count, POC 199  142 - 424 K/uL   MPV 10.3  0 - 99.8 fL   EKG lacking positive voltage in V3 but no signs of acute injury  Nitroglycerin given sublingually and produced a feeling of dizziness but did not dramatically change her feeling of chest pressure pain or its sharp intermittent component. Blood pressure remained stable.  Reexamination of the heart and lungs after all studies were completed revealed no concerning findings     Assessment & Plan:  Chest pain--probably musculoskeletal Mild shortness of breath Scapulothoracic pain--- described treatment with NSAIDs plus stretching plus heat Family history heart disease Hypothyroidism Slightly abnormal EKG Mitral valve prolapse  Discussed with Dr. Carin Primrose will see her in an expedited fashion in his office  I have completed the patient encounter in its entirety as documented by the scribe, with editing by me where necessary. Robert P. Laney Pastor, M.D.

## 2013-11-26 ENCOUNTER — Other Ambulatory Visit: Payer: Self-pay

## 2013-11-26 DIAGNOSIS — Z1239 Encounter for other screening for malignant neoplasm of breast: Secondary | ICD-10-CM

## 2013-12-05 ENCOUNTER — Ambulatory Visit (INDEPENDENT_AMBULATORY_CARE_PROVIDER_SITE_OTHER): Payer: 59

## 2013-12-05 ENCOUNTER — Ambulatory Visit (INDEPENDENT_AMBULATORY_CARE_PROVIDER_SITE_OTHER): Payer: 59 | Admitting: Family Medicine

## 2013-12-05 ENCOUNTER — Ambulatory Visit (HOSPITAL_COMMUNITY): Admission: RE | Admit: 2013-12-05 | Payer: 59 | Source: Ambulatory Visit

## 2013-12-05 ENCOUNTER — Ambulatory Visit (HOSPITAL_COMMUNITY)
Admission: RE | Admit: 2013-12-05 | Discharge: 2013-12-05 | Disposition: A | Discharge status: Home / Self Care | Payer: 59 | Source: Ambulatory Visit | Attending: Family Medicine | Admitting: Family Medicine

## 2013-12-05 VITALS — BP 100/70 | HR 70 | Temp 98.6°F | Resp 16 | Ht 64.0 in | Wt 142.6 lb

## 2013-12-05 DIAGNOSIS — M84361A Stress fracture, right tibia, initial encounter for fracture: Secondary | ICD-10-CM | POA: Diagnosis present

## 2013-12-05 DIAGNOSIS — R519 Headache, unspecified: Secondary | ICD-10-CM

## 2013-12-05 DIAGNOSIS — M79604 Pain in right leg: Secondary | ICD-10-CM

## 2013-12-05 DIAGNOSIS — J0100 Acute maxillary sinusitis, unspecified: Secondary | ICD-10-CM

## 2013-12-05 DIAGNOSIS — Z0389 Encounter for observation for other suspected diseases and conditions ruled out: Secondary | ICD-10-CM | POA: Diagnosis not present

## 2013-12-05 DIAGNOSIS — R51 Headache: Secondary | ICD-10-CM

## 2013-12-05 DIAGNOSIS — R937 Abnormal findings on diagnostic imaging of other parts of musculoskeletal system: Secondary | ICD-10-CM

## 2013-12-05 MED ORDER — AMOXICILLIN-POT CLAVULANATE 875-125 MG PO TABS: 1.0000 | ORAL_TABLET | Freq: Two times a day (BID) | ORAL | Status: DC

## 2013-12-05 NOTE — Progress Notes (Addendum)
Subjective:    Patient ID: Sally Mitchell, female    DOB: 1970/03/30, 43 y.o.   MRN: [id number]  This chart was scribed for Merri Ray, MD by Edison Simon, ED Scribe. This patient was seen in room 9 and the patient's care was started at 12:00 PM.   Chief Complaint  Patient presents with  . Headache    left side of headache; mom has hx of migraines  . Facial Swelling    left side pain/swelling  . Nasal Congestion    pt noticed white in the back of her throat; not on tonsils  . Leg Pain    right lower leg pain off/on    HPI  HPI Comments: Sally Mitchell is a 43 y.o. female who presents to the [facility] with multiple complaints: 1) headache with left side face pain and swelling, 2) nasal congestion and white spots on the back of her throat, and 3) right leg pain.  1) She states her primary concern is her headache; she states the left side of her face feels puffy and has headache with onset 3 days ago. She report using Ibuprofen and Zyrtec only. Left face cheek, and forehead. She denies any yellow-green nasal drainage. She reports having some congestion since over 1 week ago. She reports history of sinus infection, the last of which was years ago. She reports some left sided dental pain, sometimes sensitive to cold food, with onset a few days go. She denies recent dental work. She also reports bad breath. She denies fever or ear pain. 2) She reports drainage down the back of her throat but denies sore throat. She denies cough, chest congestion, chest pain, or tonsil purulence.  3) She reports having right calf pain and swelling intermittently with onset a few weeks ago. She states the swelling is down now but is still painful. She states the pain feels close to the bone. She denies recent long car or plane rides. She denies recent changes to activity or exercise. She states she works for [employer] with [employer] and states she walks a lot, but denies recent changes.  She denies using contraceptives or family history of blood clots. She denies smoking. She denies prior shin splints. She denies SOB  Patient Active Problem List   Diagnosis Date Noted  . LUQ pain 01/12/2012  . Fibroids 06/21/2011  . Hypothyroidism 06/21/2011  . Abdominal pain 06/21/2011  . Hypercholesterolemia 06/21/2011  . 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 Bacterial vaginitis   . H/O menorrhagia   . H/O dysmenorrhea   . Candida vaginitis   . Dysuria   . Irregular menses   . Anemia   . Fibroids, submucosal   . FH: breast cancer   . H/O hematuria   . Hair loss   . H/O fatigue   . History of kidney stones   . Hypothyroidism   . H/O hyperthyroidism   . GERD (gastroesophageal reflux disease)   . Hypercholesterolemia     mild  . Eczema     seborrhecic dermatitis  . Hypopotassemia   . Insomnia   . Iron deficiency anemia   . Hiatal hernia    Past Surgical History  Procedure Laterality Date  . Hysteroscopy  03/31/2004    with resection of endometrial fibroid  . Dilation and curettage of uterus    . Wisdom tooth extraction    . Cesarean section  Allergies  Allergen Reactions  . Doxycycline Other (See Comments)    Pt get stomach cramps  . E-Mycin [Erythromycin Base]   . Septra [Bactrim]   . Sulfa Antibiotics Other (See Comments)    Pt throat closes up   Prior to Admission medications   Medication Sig Start Date End Date Taking? Authorizing Provider  cetirizine (ZYRTEC) 10 MG tablet Take 10 mg by mouth as needed.    Yes Historical Provider, MD  levothyroxine (SYNTHROID, LEVOTHROID) 112 MCG tablet Take 112 mcg by mouth daily.   Yes Historical Provider, MD   History   Social History  . Marital Status: Married    Spouse Name: N/A    Number of Children: 1  . Years of Education: N/A   Occupational History  . cma    Social History Main Topics  . Smoking status: Never Smoker   . Smokeless tobacco: Never Used  .  Alcohol Use: No  . Drug Use: No  . Sexual Activity: Not on file   Other Topics Concern  . Not on file   Social History Narrative   Daily caffeine     Review of Systems  Constitutional: Negative for fever.  HENT: Positive for congestion, dental problem (pain and cold sensitivity), facial swelling and postnasal drip. Negative for ear pain and sore throat.        Bad breath  Respiratory: Negative for cough, chest tightness and shortness of breath.   Cardiovascular: Positive for leg swelling. Negative for chest pain.  Musculoskeletal:       Right leg pain  Neurological: Positive for headaches.       Objective:   Physical Exam  Vitals reviewed. Constitutional: She is oriented to person, place, and time. She appears well-developed and well-nourished. No distress.  HENT:  Head: Normocephalic and atraumatic.  Right Ear: Hearing, tympanic membrane, external ear and ear canal normal.  Left Ear: Hearing, tympanic membrane, external ear and ear canal normal.  Nose: Nose normal.  Mouth/Throat: Oropharynx is clear and moist. No oropharyngeal exudate.  No erythema or exudate on tonsils, moist mucosa, tenderness along left maxillary and left frontal sinus with flight fullness appearance of left maxillary area without erythema or rash on skin, filling along 2nd premolar and first molar and crowns in place on 2 molars in left upper teeth but no surrounding gum erythema or exudate, no tenderness on percussion of teeth  Eyes: Conjunctivae and EOM are normal. Pupils are equal, round, and reactive to light.  Neck: Normal range of motion. Neck supple.  Cardiovascular: Normal rate, regular rhythm, normal heart sounds and intact distal pulses.   No murmur heard. Pulmonary/Chest: Effort normal and breath sounds normal. No respiratory distress. She has no wheezes. She has no rhonchi.  Musculoskeletal: Normal range of motion.  RLE negative homans, very minimal tenderness along medial tibia just into the  medial aspect of calf at tibial border but no posterior calf tenderness, left calf circumference 35cm at 15cm below patella, equal on the right at 35cm at 15cm below patella  Neurological: She is alert and oriented to person, place, and time.  Skin: Skin is warm and dry. No rash noted.  Psychiatric: She has a normal mood and affect. Her behavior is normal.   Filed Vitals:   12/05/13 1120  BP: 100/70  Pulse: 70  Temp: 98.6 F (37 C)  TempSrc: Oral  Resp: 16  Height: 5\' 4"  (1.626 m)  Weight: 142 lb 9.6 oz (64.683 kg)  SpO2: 98%  [facility] reading (PRIMARY) by  Dr. Carlota Raspberry: R tib-fib. Periosteal reaction/elevation of anteromedial tibia at approximately 16cm proximal to tibial plafond. No visible black line/fracture.    1:32 PM XR call report: FINDINGS:  Frontal and lateral views were obtained. There is a subtle lucency  in the cortex of the mid tibia anteriorly with minimal periosteal  thickening anteriorly in this area. This appearance raises question  of a subtle incomplete stress type fracture. No other evidence of  potential fracture. No dislocation. Joint spaces appear intact. No  erosive change.  IMPRESSION:  Suspect incomplete stress type fracture in the anterior tibia  involving only the anterior cortex. Study otherwise unremarkable.      Assessment & Plan:   Sally Mitchell is a 43 y.o. female Right leg pain - Plan: DG Tibia/Fibula Right, MR Tibia Fibula Right Wo Contrast Abnormal musculoskeletal x-ray - Plan: MR Tibia Fibula Right Wo Contrast  - possible stress fracture - anterior tibia by XR, but hx of more medial swelling and ttp, which is now improving. NKI and no running or other preceding change in activity. ? Prior injury vs new stress fx. Will try to obtain MRI today and if not able to have today, then can have her NWB with crutches until MRI obtained as possibly anterior tibial involvement.   Face pain, acute maxillary sinusitis, recurrence not specified - Plan:  amoxicillin-clavulanate (AUGMENTIN) 875-125 MG per tablet  - start augmentin, saline ns, sx care discussed and rtc precautions. H/o on AVS.    Meds ordered this encounter  Medications  . amoxicillin-clavulanate (AUGMENTIN) 875-125 MG per tablet    Sig: Take 1 tablet by mouth 2 (two) times daily.    Dispense:  20 tablet    Refill:  0   Patient Instructions  Saline nasal spray at least 4 times per day, drink plenty of fluids, start Augmentin for suspected sinus infection. Return to the clinic or go to the nearest emergency room if any of your symptoms worsen or new symptoms occur.  Your leg pain may have been shin splints, but some slight abnormal area on tibia in this area without a true fracture seen.  This can be an area of stress injury or fracture. As your symptoms are improving - avoid any extra walking and no running for now. If not continuing to improve and resolution of pain over next 2 weeks - return for recheck and possible other imaging such as an MRI. Sooner if worse.   Sinusitis Sinusitis is redness, soreness, and inflammation of the paranasal sinuses. Paranasal sinuses are air pockets within the bones of your face (beneath the eyes, the middle of the forehead, or above the eyes). In healthy paranasal sinuses, mucus is able to drain out, and air is able to circulate through them by way of your nose. However, when your paranasal sinuses are inflamed, mucus and air can become trapped. This can allow bacteria and other germs to grow and cause infection. Sinusitis can develop quickly and last only a short time (acute) or continue over a long period (chronic). Sinusitis that lasts for more than 12 weeks is considered chronic.  CAUSES  Causes of sinusitis include:  Allergies.  Structural abnormalities, such as displacement of the cartilage that separates your nostrils (deviated septum), which can decrease the air flow through your nose and sinuses and affect sinus  drainage.  Functional abnormalities, such as when the small hairs (cilia) that line your sinuses and help remove mucus do not work properly or  are not present. SIGNS AND SYMPTOMS  Symptoms of acute and chronic sinusitis are the same. The primary symptoms are pain and pressure around the affected sinuses. Other symptoms include:  Upper toothache.  Earache.  Headache.  Bad breath.  Decreased sense of smell and taste.  A cough, which worsens when you are lying flat.  Fatigue.  Fever.  Thick drainage from your nose, which often is green and may contain pus (purulent).  Swelling and warmth over the affected sinuses. DIAGNOSIS  Your health care provider will perform a physical exam. During the exam, your health care provider may:  Look in your nose for signs of abnormal growths in your nostrils (nasal polyps).  Tap over the affected sinus to check for signs of infection.  View the inside of your sinuses (endoscopy) using an imaging device that has a light attached (endoscope). If your health care provider suspects that you have chronic sinusitis, one or more of the following tests may be recommended:  Allergy tests.  Nasal culture. A sample of mucus is taken from your nose, sent to a lab, and screened for bacteria.  Nasal cytology. A sample of mucus is taken from your nose and examined by your health care provider to determine if your sinusitis is related to an allergy. TREATMENT  Most cases of acute sinusitis are related to a viral infection and will resolve on their own within 10 days. Sometimes medicines are prescribed to help relieve symptoms (pain medicine, decongestants, nasal steroid sprays, or saline sprays).  However, for sinusitis related to a bacterial infection, your health care provider will prescribe antibiotic medicines. These are medicines that will help kill the bacteria causing the infection.  Rarely, sinusitis is caused by a fungal infection. In theses cases,  your health care provider will prescribe antifungal medicine. For some cases of chronic sinusitis, surgery is needed. Generally, these are cases in which sinusitis recurs more than 3 times per year, despite other treatments. HOME CARE INSTRUCTIONS   Drink plenty of water. Water helps thin the mucus so your sinuses can drain more easily.  Use a humidifier.  Inhale steam 3 to 4 times a day (for example, sit in the bathroom with the shower running).  Apply a warm, moist washcloth to your face 3 to 4 times a day, or as directed by your health care provider.  Use saline nasal sprays to help moisten and clean your sinuses.  Take medicines only as directed by your health care provider.  If you were prescribed either an antibiotic or antifungal medicine, finish it all even if you start to feel better. SEEK IMMEDIATE MEDICAL CARE IF:  You have increasing pain or severe headaches.  You have nausea, vomiting, or drowsiness.  You have swelling around your face.  You have vision problems.  You have a stiff neck.  You have difficulty breathing. MAKE SURE YOU:   Understand these instructions.  Will watch your condition.  Will get help right away if you are not doing well or get worse. Document Released: 02/06/2005 Document Revised: 06/23/2013 Document Reviewed: 02/21/2011 [facility] Patient Information 2015 [place], [place]. This information is not intended to replace advice given to you by your health care provider. Make sure you discuss any questions you have with your health care provider.        I personally performed the services described in this documentation, which was scribed in my presence. The recorded information has been reviewed and considered, and addended by me as needed.   Marland Kitchen

## 2013-12-05 NOTE — Patient Instructions (Addendum)
Saline nasal spray at least 4 times per day, drink plenty of fluids, start Augmentin for suspected sinus infection. Return to the clinic or go to the nearest emergency room if any of your symptoms worsen or new symptoms occur.  Your leg pain may have been shin splints, but some slight abnormal area on tibia in this area without a true fracture seen.  This can be an area of stress injury or fracture. As your symptoms are improving - avoid any extra walking and no running for now. If not continuing to improve and resolution of pain over next 2 weeks - return for recheck and possible other imaging such as an MRI. Sooner if worse.   Sinusitis Sinusitis is redness, soreness, and inflammation of the paranasal sinuses. Paranasal sinuses are air pockets within the bones of your face (beneath the eyes, the middle of the forehead, or above the eyes). In healthy paranasal sinuses, mucus is able to drain out, and air is able to circulate through them by way of your nose. However, when your paranasal sinuses are inflamed, mucus and air can become trapped. This can allow bacteria and other germs to grow and cause infection. Sinusitis can develop quickly and last only a short time (acute) or continue over a long period (chronic). Sinusitis that lasts for more than 12 weeks is considered chronic.  CAUSES  Causes of sinusitis include:  Allergies.  Structural abnormalities, such as displacement of the cartilage that separates your nostrils (deviated septum), which can decrease the air flow through your nose and sinuses and affect sinus drainage.  Functional abnormalities, such as when the small hairs (cilia) that line your sinuses and help remove mucus do not work properly or are not present. SIGNS AND SYMPTOMS  Symptoms of acute and chronic sinusitis are the same. The primary symptoms are pain and pressure around the affected sinuses. Other symptoms include:  Upper toothache.  Earache.  Headache.  Bad  breath.  Decreased sense of smell and taste.  A cough, which worsens when you are lying flat.  Fatigue.  Fever.  Thick drainage from your nose, which often is green and may contain pus (purulent).  Swelling and warmth over the affected sinuses. DIAGNOSIS  Your health care provider will perform a physical exam. During the exam, your health care provider may:  Look in your nose for signs of abnormal growths in your nostrils (nasal polyps).  Tap over the affected sinus to check for signs of infection.  View the inside of your sinuses (endoscopy) using an imaging device that has a light attached (endoscope). If your health care provider suspects that you have chronic sinusitis, one or more of the following tests may be recommended:  Allergy tests.  Nasal culture. A sample of mucus is taken from your nose, sent to a lab, and screened for bacteria.  Nasal cytology. A sample of mucus is taken from your nose and examined by your health care provider to determine if your sinusitis is related to an allergy. TREATMENT  Most cases of acute sinusitis are related to a viral infection and will resolve on their own within 10 days. Sometimes medicines are prescribed to help relieve symptoms (pain medicine, decongestants, nasal steroid sprays, or saline sprays).  However, for sinusitis related to a bacterial infection, your health care provider will prescribe antibiotic medicines. These are medicines that will help kill the bacteria causing the infection.  Rarely, sinusitis is caused by a fungal infection. In theses cases, your health  care provider will prescribe antifungal medicine. For some cases of chronic sinusitis, surgery is needed. Generally, these are cases in which sinusitis recurs more than 3 times per year, despite other treatments. HOME CARE INSTRUCTIONS   Drink plenty of water. Water helps thin the mucus so your sinuses can drain more easily.  Use a humidifier.  Inhale steam 3 to 4  times a day (for example, sit in the bathroom with the shower running).  Apply a warm, moist washcloth to your face 3 to 4 times a day, or as directed by your health care provider.  Use saline nasal sprays to help moisten and clean your sinuses.  Take medicines only as directed by your health care provider.  If you were prescribed either an antibiotic or antifungal medicine, finish it all even if you start to feel better. SEEK IMMEDIATE MEDICAL CARE IF:  You have increasing pain or severe headaches.  You have nausea, vomiting, or drowsiness.  You have swelling around your face.  You have vision problems.  You have a stiff neck.  You have difficulty breathing. MAKE SURE YOU:   Understand these instructions.  Will watch your condition.  Will get help right away if you are not doing well or get worse. Document Released: 02/06/2005 Document Revised: 06/23/2013 Document Reviewed: 02/21/2011 [facility] Patient Information 2015 [place], [place]. This information is not intended to replace advice given to you by your health care provider. Make sure you discuss any questions you have with your health care provider.   1:36 VC:[id number] - called pt on phone to advise of abnormal XR, and plan for MRI +/- NWB with crutches if unable to have MRI today.

## 2013-12-09 ENCOUNTER — Other Ambulatory Visit: Payer: Self-pay

## 2013-12-09 DIAGNOSIS — Z1231 Encounter for screening mammogram for malignant neoplasm of breast: Secondary | ICD-10-CM

## 2013-12-10 ENCOUNTER — Ambulatory Visit
Admission: RE | Admit: 2013-12-10 | Discharge: 2013-12-10 | Disposition: A | Discharge status: Home / Self Care | Payer: 59 | Source: Ambulatory Visit

## 2013-12-10 DIAGNOSIS — Z1231 Encounter for screening mammogram for malignant neoplasm of breast: Secondary | ICD-10-CM

## 2014-10-27 ENCOUNTER — Ambulatory Visit (INDEPENDENT_AMBULATORY_CARE_PROVIDER_SITE_OTHER): Payer: 59 | Admitting: Physician Assistant

## 2014-10-27 VITALS — BP 104/60 | HR 75 | Temp 98.5°F | Resp 16 | Ht 64.0 in | Wt 152.0 lb

## 2014-10-27 DIAGNOSIS — H9202 Otalgia, left ear: Secondary | ICD-10-CM

## 2014-10-27 DIAGNOSIS — K088 Other specified disorders of teeth and supporting structures: Secondary | ICD-10-CM

## 2014-10-27 DIAGNOSIS — K0889 Other specified disorders of teeth and supporting structures: Secondary | ICD-10-CM

## 2014-10-27 NOTE — Progress Notes (Signed)
   Subjective:    Patient ID: Sally Mitchell, female    DOB: [birth year], 44 y.o.   MRN: [id number]  HPI Patient presents for left ear and tooth pain that started this morning. Ear pain first noticed when she woke up. Pain is sporadic and sharp in quality. Pain does not radiate. Denies trauma or injury. Denies fever, HA, dizziness, drainage of ear/eyes/nose, sinus pressure, sore throat, or tinnitus. Has seasonal allergies and has post-nasal drip from time to time. Controlled with daily cetirizine. Tooth pain occurred later in day on left side, but can't tell if coming from the top or bottom. Has been able to eat without difficulty. Has caps on teeth on both top and bottom and fill-ins.  Allergies  Allergen Reactions  . Doxycycline Other (See Comments)    Pt get stomach cramps  . E-Mycin [Erythromycin Base]   . Septra [Bactrim]   . Sulfa Antibiotics Other (See Comments)    Pt throat closes up   Review of Systems As noted above.    Objective:   Physical Exam  Constitutional: She is oriented to person, place, and time. She appears well-developed and well-nourished. No distress.  Blood pressure 104/60, pulse 75, temperature 98.5 F (36.9 C), temperature source Oral, resp. rate 16, height 5\' 4"  (1.626 m), weight 152 lb (68.947 kg), SpO2 99 %.  HENT:  Head: Normocephalic and atraumatic.  Right Ear: Tympanic membrane, external ear and ear canal normal. No drainage, swelling or tenderness. No mastoid tenderness. Tympanic membrane is not injected, not scarred, not perforated, not erythematous, not retracted and not bulging. No middle ear effusion. No hemotympanum.  Left Ear: Tympanic membrane normal. There is tenderness. No drainage or swelling. No mastoid tenderness. Tympanic membrane is not injected, not scarred, not perforated, not erythematous, not retracted and not bulging.  No middle ear effusion. No hemotympanum.  Nose: Nose normal.  Mouth/Throat: Oropharynx is clear and moist. No  oropharyngeal exudate.  Small area of increased redness at 12:00 position of canal  Eyes: Conjunctivae are normal. Right eye exhibits no discharge. Left eye exhibits no discharge. No scleral icterus.  Neck: Neck supple.  Cardiovascular: Normal rate, regular rhythm and normal heart sounds.  Exam reveals no gallop and no friction rub.   No murmur heard. Pulmonary/Chest: Effort normal and breath sounds normal. No respiratory distress. She has no wheezes. She has no rales.  Lymphadenopathy:    She has cervical adenopathy.  Neurological: She is alert and oriented to person, place, and time.  Skin: She is not diaphoretic.  Psychiatric: She has a normal mood and affect. Her behavior is normal. Judgment and thought content normal.       Assessment & Plan:  1. Ear pain, left 2. Tooth pain PE benign. Possible trauma in sleep. Can take ibuprofen for pain. If no improvement should call back and will refer to ENT.    Alveta Heimlich PA-C  [facility] 10/27/2014 7:26 PM

## 2015-03-12 MED FILL — SYNTHROID 112 MCG TABLET: 112 | 30 days supply | Qty: 30 | Fill #5

## 2015-04-12 MED FILL — SYNTHROID 112 MCG TABLET: 112 | 30 days supply | Qty: 30 | Fill #6

## 2015-05-05 DIAGNOSIS — R002 Palpitations: Secondary | ICD-10-CM | POA: Diagnosis not present

## 2015-05-05 DIAGNOSIS — E039 Hypothyroidism, unspecified: Secondary | ICD-10-CM | POA: Diagnosis not present

## 2015-05-14 MED FILL — SYNTHROID 112 MCG TABLET: 112 | 30 days supply | Qty: 30 | Fill #0

## 2015-05-14 MED FILL — PROPRANOLOL 10 MG TABLET: 10 | 30 days supply | Qty: 60 | Fill #0

## 2015-06-16 ENCOUNTER — Other Ambulatory Visit: Payer: Self-pay

## 2015-06-16 DIAGNOSIS — Z1231 Encounter for screening mammogram for malignant neoplasm of breast: Secondary | ICD-10-CM

## 2015-06-16 MED FILL — SYNTHROID 112 MCG TABLET: 112 | 30 days supply | Qty: 30 | Fill #1

## 2015-06-30 ENCOUNTER — Ambulatory Visit
Admission: RE | Admit: 2015-06-30 | Discharge: 2015-06-30 | Disposition: A | Discharge status: Home / Self Care | Payer: 59 | Source: Ambulatory Visit

## 2015-06-30 DIAGNOSIS — Z1231 Encounter for screening mammogram for malignant neoplasm of breast: Secondary | ICD-10-CM | POA: Diagnosis not present

## 2015-07-20 MED FILL — SYNTHROID 112 MCG TABLET: 112 | 30 days supply | Qty: 30 | Fill #2

## 2015-08-18 DIAGNOSIS — Z6826 Body mass index (BMI) 26.0-26.9, adult: Secondary | ICD-10-CM | POA: Diagnosis not present

## 2015-08-18 DIAGNOSIS — Z01419 Encounter for gynecological examination (general) (routine) without abnormal findings: Secondary | ICD-10-CM | POA: Diagnosis not present

## 2015-08-19 MED FILL — SYNTHROID 112 MCG TABLET: 112 | 30 days supply | Qty: 30 | Fill #3

## 2015-09-08 DIAGNOSIS — D259 Leiomyoma of uterus, unspecified: Secondary | ICD-10-CM | POA: Diagnosis not present

## 2015-09-08 DIAGNOSIS — N920 Excessive and frequent menstruation with regular cycle: Secondary | ICD-10-CM | POA: Diagnosis not present

## 2015-09-20 MED FILL — SYNTHROID 112 MCG TABLET: 112 | 30 days supply | Qty: 30 | Fill #4

## 2015-10-20 MED FILL — SYNTHROID 112 MCG TABLET: 112 | 30 days supply | Qty: 30 | Fill #5

## 2015-10-27 DIAGNOSIS — J329 Chronic sinusitis, unspecified: Secondary | ICD-10-CM | POA: Diagnosis not present

## 2015-10-27 MED FILL — AMOX-CLAV 875-125 MG TABLET: 875-125 | 10 days supply | Qty: 20 | Fill #0

## 2015-10-27 MED FILL — NAPROXEN 500 MG TABLET: 500 | 10 days supply | Qty: 20 | Fill #0

## 2015-11-19 MED FILL — SYNTHROID 112 MCG TABLET: 112 | 30 days supply | Qty: 30 | Fill #6

## 2015-12-01 DIAGNOSIS — Z23 Encounter for immunization: Secondary | ICD-10-CM | POA: Diagnosis not present

## 2015-12-20 MED FILL — SYNTHROID 112 MCG TABLET: 112 | 30 days supply | Qty: 30 | Fill #7

## 2016-01-19 MED FILL — SYNTHROID 112 MCG TABLET: 112 | 30 days supply | Qty: 30 | Fill #8

## 2016-02-18 MED FILL — SYNTHROID 112 MCG TABLET: 112 | 30 days supply | Qty: 30 | Fill #9

## 2016-03-22 MED FILL — SYNTHROID 112 MCG TABLET: 112 | 30 days supply | Qty: 30 | Fill #10

## 2016-04-21 MED FILL — SYNTHROID 112 MCG TABLET: 112 | 30 days supply | Qty: 30 | Fill #11

## 2016-05-03 DIAGNOSIS — R1012 Left upper quadrant pain: Secondary | ICD-10-CM | POA: Diagnosis not present

## 2016-05-03 DIAGNOSIS — L309 Dermatitis, unspecified: Secondary | ICD-10-CM | POA: Diagnosis not present

## 2016-05-03 DIAGNOSIS — E039 Hypothyroidism, unspecified: Secondary | ICD-10-CM | POA: Diagnosis not present

## 2016-05-03 MED FILL — FLUOCINOLONE 0.01% SCALP OI: 0.01 | 30 days supply | Qty: 118 | Fill #0

## 2016-05-05 ENCOUNTER — Other Ambulatory Visit: Payer: Self-pay | Admitting: Family Medicine

## 2016-05-05 DIAGNOSIS — R1012 Left upper quadrant pain: Secondary | ICD-10-CM

## 2016-05-12 ENCOUNTER — Ambulatory Visit
Admission: RE | Admit: 2016-05-12 | Discharge: 2016-05-12 | Disposition: A | Discharge status: Home / Self Care | Payer: 59 | Source: Ambulatory Visit | Attending: Family Medicine | Admitting: Family Medicine

## 2016-05-12 DIAGNOSIS — R1012 Left upper quadrant pain: Secondary | ICD-10-CM

## 2016-05-12 MED ORDER — IOPAMIDOL (ISOVUE-300) INJECTION 61%
100.0000 mL | Freq: Once | INTRAVENOUS | Status: AC | PRN
  Administered 2016-05-12: 100 mL via INTRAVENOUS

## 2016-05-12 MED FILL — SUCRALFATE 1 GM TABLET: 1 | 30 days supply | Qty: 120 | Fill #0

## 2016-05-17 MED FILL — SYNTHROID 112 MCG TABLET: 112 | 30 days supply | Qty: 30 | Fill #0

## 2016-06-21 MED FILL — SYNTHROID 112 MCG TABLET: 112 | 30 days supply | Qty: 30 | Fill #1

## 2016-07-24 MED FILL — SYNTHROID 112 MCG TABLET: 112 | 30 days supply | Qty: 30 | Fill #2

## 2016-08-22 MED FILL — SYNTHROID 112 MCG TABLET: 112 | 30 days supply | Qty: 30 | Fill #3

## 2016-09-22 MED FILL — SYNTHROID 112 MCG TABLET: 112 | 30 days supply | Qty: 30 | Fill #4

## 2016-09-27 ENCOUNTER — Other Ambulatory Visit: Payer: Self-pay | Admitting: Obstetrics and Gynecology

## 2016-09-27 ENCOUNTER — Ambulatory Visit
Admission: RE | Admit: 2016-09-27 | Discharge: 2016-09-27 | Disposition: A | Discharge status: Home / Self Care | Payer: 59 | Source: Ambulatory Visit | Attending: Obstetrics and Gynecology | Admitting: Obstetrics and Gynecology

## 2016-09-27 DIAGNOSIS — R319 Hematuria, unspecified: Secondary | ICD-10-CM | POA: Diagnosis not present

## 2016-09-27 DIAGNOSIS — Z1231 Encounter for screening mammogram for malignant neoplasm of breast: Secondary | ICD-10-CM

## 2016-09-27 DIAGNOSIS — D649 Anemia, unspecified: Secondary | ICD-10-CM | POA: Diagnosis not present

## 2016-09-27 DIAGNOSIS — Z13220 Encounter for screening for lipoid disorders: Secondary | ICD-10-CM | POA: Diagnosis not present

## 2016-09-27 DIAGNOSIS — Z01419 Encounter for gynecological examination (general) (routine) without abnormal findings: Secondary | ICD-10-CM | POA: Diagnosis not present

## 2016-09-27 DIAGNOSIS — Z1329 Encounter for screening for other suspected endocrine disorder: Secondary | ICD-10-CM | POA: Diagnosis not present

## 2016-09-27 DIAGNOSIS — Z13228 Encounter for screening for other metabolic disorders: Secondary | ICD-10-CM | POA: Diagnosis not present

## 2016-10-20 MED FILL — SYNTHROID 112 MCG TABLET: 112 | 30 days supply | Qty: 30 | Fill #5

## 2016-11-22 MED FILL — SYNTHROID 112 MCG TABLET: 112 | 30 days supply | Qty: 30 | Fill #6

## 2016-12-25 MED FILL — SYNTHROID 112 MCG TABLET: 112 | 30 days supply | Qty: 30 | Fill #7

## 2017-01-24 MED FILL — SYNTHROID 112 MCG TABLET: 112 | 30 days supply | Qty: 30 | Fill #8

## 2017-02-22 MED FILL — SYNTHROID 112 MCG TABLET: 112 | 30 days supply | Qty: 30 | Fill #9

## 2017-03-21 DIAGNOSIS — D259 Leiomyoma of uterus, unspecified: Secondary | ICD-10-CM | POA: Diagnosis not present

## 2017-03-21 DIAGNOSIS — R5383 Other fatigue: Secondary | ICD-10-CM | POA: Diagnosis not present

## 2017-03-23 MED FILL — VIT D2 1.25 MG (50,000 UNIT: 1.25 MG | 56 days supply | Qty: 8 | Fill #0

## 2017-03-26 MED FILL — SYNTHROID 112 MCG TABLET: 112 | 30 days supply | Qty: 30 | Fill #10

## 2017-03-28 DIAGNOSIS — E039 Hypothyroidism, unspecified: Secondary | ICD-10-CM | POA: Diagnosis not present

## 2017-03-28 DIAGNOSIS — J0100 Acute maxillary sinusitis, unspecified: Secondary | ICD-10-CM | POA: Diagnosis not present

## 2017-03-29 MED FILL — AZITHROMYCIN 250 MG TABLET: 250 | 5 days supply | Qty: 6 | Fill #0

## 2017-04-25 MED FILL — SYNTHROID 112 MCG TABLET: 112 | 30 days supply | Qty: 30 | Fill #11

## 2017-05-25 MED FILL — SYNTHROID 112 MCG TABLET: 112 | 30 days supply | Qty: 30 | Fill #0

## 2017-05-30 DIAGNOSIS — H524 Presbyopia: Secondary | ICD-10-CM | POA: Diagnosis not present

## 2017-06-25 MED FILL — SYNTHROID 112 MCG TABLET: 112 | 30 days supply | Qty: 30 | Fill #1

## 2017-07-24 MED FILL — SYNTHROID 112 MCG TABLET: 112 | 30 days supply | Qty: 30 | Fill #2

## 2017-08-22 MED FILL — SYNTHROID 112 MCG TABLET: 112 | 30 days supply | Qty: 30 | Fill #3

## 2017-09-21 MED FILL — SYNTHROID 112 MCG TABLET: 112 | 30 days supply | Qty: 30 | Fill #4

## 2017-10-04 MED FILL — AZITHROMYCIN 250 MG TABLET: 250 | 5 days supply | Qty: 6 | Fill #0

## 2017-10-23 MED FILL — SYNTHROID 112 MCG TABLET: 112 | 30 days supply | Qty: 30 | Fill #5

## 2017-11-14 ENCOUNTER — Ambulatory Visit
Admission: RE | Admit: 2017-11-14 | Discharge: 2017-11-14 | Disposition: A | Discharge status: Home / Self Care | Payer: 59 | Source: Ambulatory Visit | Attending: Obstetrics and Gynecology | Admitting: Obstetrics and Gynecology

## 2017-11-14 ENCOUNTER — Other Ambulatory Visit: Payer: Self-pay | Admitting: Obstetrics and Gynecology

## 2017-11-14 DIAGNOSIS — Z1231 Encounter for screening mammogram for malignant neoplasm of breast: Secondary | ICD-10-CM

## 2017-11-22 MED FILL — SYNTHROID 112 MCG TABLET: 112 | 30 days supply | Qty: 30 | Fill #6

## 2017-12-21 MED FILL — SYNTHROID 112 MCG TABLET: 112 | 30 days supply | Qty: 30 | Fill #7

## 2018-01-16 MED FILL — SYNTHROID 112 MCG TABLET: 112 | 30 days supply | Qty: 30 | Fill #8

## 2018-02-18 MED FILL — SYNTHROID 112 MCG TABLET: 112 | 30 days supply | Qty: 30 | Fill #9

## 2018-03-21 MED FILL — SYNTHROID 112 MCG TABLET: 112 | 30 days supply | Qty: 30 | Fill #10

## 2018-04-23 MED FILL — SYNTHROID 112 MCG TABLET: 112 | 30 days supply | Qty: 30 | Fill #0 | Status: TO

## 2018-05-21 MED FILL — SYNTHROID 112 MCG TABLET: 112 | 60 days supply | Qty: 60 | Fill #0

## 2018-07-18 ENCOUNTER — Encounter: Payer: Self-pay | Admitting: Family Medicine

## 2018-07-22 ENCOUNTER — Encounter: Payer: Self-pay | Admitting: Family Medicine

## 2018-07-22 ENCOUNTER — Other Ambulatory Visit: Payer: Self-pay

## 2018-07-22 ENCOUNTER — Ambulatory Visit (INDEPENDENT_AMBULATORY_CARE_PROVIDER_SITE_OTHER): Payer: 59 | Admitting: Family Medicine

## 2018-07-22 ENCOUNTER — Ambulatory Visit (INDEPENDENT_AMBULATORY_CARE_PROVIDER_SITE_OTHER): Payer: 59

## 2018-07-22 VITALS — BP 103/64 | HR 75 | Temp 98.8°F | Ht 64.0 in | Wt 158.0 lb

## 2018-07-22 DIAGNOSIS — M25562 Pain in left knee: Secondary | ICD-10-CM

## 2018-07-22 DIAGNOSIS — Z0000 Encounter for general adult medical examination without abnormal findings: Secondary | ICD-10-CM

## 2018-07-22 DIAGNOSIS — E039 Hypothyroidism, unspecified: Secondary | ICD-10-CM

## 2018-07-22 DIAGNOSIS — E7800 Pure hypercholesterolemia, unspecified: Secondary | ICD-10-CM

## 2018-07-22 DIAGNOSIS — R5383 Other fatigue: Secondary | ICD-10-CM

## 2018-07-22 DIAGNOSIS — Z7689 Persons encountering health services in other specified circumstances: Secondary | ICD-10-CM

## 2018-07-22 DIAGNOSIS — Z131 Encounter for screening for diabetes mellitus: Secondary | ICD-10-CM

## 2018-07-22 DIAGNOSIS — Z1389 Encounter for screening for other disorder: Secondary | ICD-10-CM | POA: Diagnosis not present

## 2018-07-22 DIAGNOSIS — D219 Benign neoplasm of connective and other soft tissue, unspecified: Secondary | ICD-10-CM

## 2018-07-22 LAB — POCT URINALYSIS DIP (CLINITEK)
Bilirubin, UA: NEGATIVE
Glucose, UA: NEGATIVE mg/dL
Ketones, POC UA: NEGATIVE mg/dL
Leukocytes, UA: NEGATIVE
Nitrite, UA: NEGATIVE
POC PROTEIN,UA: NEGATIVE
Spec Grav, UA: >=1.03 — AB (ref 1.010–1.025)
Urobilinogen, UA: 0.2 E.U./dL
pH, UA: 6 (ref 5.0–8.0)

## 2018-07-22 MED ORDER — FLUOCINOLONE ACETONIDE BODY 0.01 % EX OIL: TOPICAL_OIL | CUTANEOUS | 2 refills | Status: DC

## 2018-07-22 MED ORDER — IPRATROPIUM BROMIDE 0.03 % NA SOLN: 2.0000 | Freq: Two times a day (BID) | NASAL | 0 refills | Status: DC

## 2018-07-22 MED ORDER — MELOXICAM 15 MG PO TABS: 15.0000 mg | ORAL_TABLET | Freq: Every day | ORAL | 0 refills | Status: DC

## 2018-07-22 MED ORDER — FEXOFENADINE HCL 180 MG PO TABS: 180.0000 mg | ORAL_TABLET | Freq: Every day | ORAL | 1 refills | Status: DC

## 2018-07-22 MED ORDER — LEVOTHYROXINE SODIUM 112 MCG PO TABS: 112.0000 ug | ORAL_TABLET | Freq: Every day | ORAL | 2 refills | Status: DC

## 2018-07-22 MED FILL — SYNTHROID 112 MCG TABLET: 112 | 90 days supply | Qty: 90 | Fill #0

## 2018-07-22 MED FILL — MELOXICAM 15 MG TABLET: 15 | 30 days supply | Qty: 30 | Fill #0

## 2018-07-22 MED FILL — IPRATROPIUM 0.03% SPRAY: 0.03 | 43 days supply | Qty: 30 | Fill #0

## 2018-07-22 MED FILL — FLUOCINOLONE ACETONIDE BODY: 0.01 | 20 days supply | Qty: 118 | Fill #0

## 2018-07-22 NOTE — Progress Notes (Signed)
Sally Mitchell, is a 48 y.o. female  IOX:[id number]  QAS:[id number]  DOB - 24-Aug-1970  CC:  Chief Complaint  Patient presents with  . Establish Care  . Knee Pain       HPI: Sally Mitchell is a 48 y.o. female is here today to establish care.   Sally Mitchell has Fibroids; Hypothyroidism; Abdominal pain; Hypercholesterolemia; Prolonged grief reaction; and LUQ pain on their problem list.    Knee pain  Two weeks left knee pain. No warmth, although endorses swelling. She has taken ibuprofen with some improvement. 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  She denies any known injury.  No prior history of arthritis.  Headaches Endorses seasonal allergies. Currently not taking any antihistamine therapy as fears medication will cause sedation. Endorses frontal and maxillary headache type pain. Frontal portions of head  Pain characterized as pressure. She endorses post nasal drainage   Hypothyroidism  Ablation for thyroid several years ago. No recent TSH level check. Hyperthyroidism managed levothyroxine 112 mcg.   Vitamin D and history of iron deficiency anemia Endorses a history of iron deficiency anemia and low vitamin D. Requesting both levels to be checked today. She has history of fibroid. No current heavy bleeding.  Patient denies new headaches, chest pain, abdominal pain, nausea, new weakness , numbness or tingling, SOB, edema, or worrisome cough.   Current medications: Current Outpatient Medications:  .  Cholecalciferol (VITAMIN D3 PO), Take 2,000 Units by mouth daily., Disp: , Rfl:  .  Ferrous Sulfate (IRON PO), Take 65 mg by mouth daily., Disp: , Rfl:  .  Fluocinolone Acetonide Body (DERMA-SMOOTHE/FS BODY) 0.01 % OIL, Apply topically as needed., Disp: , Rfl:  .  levothyroxine (SYNTHROID, LEVOTHROID) 112 MCG tablet, Take 112 mcg by mouth daily., Disp: , Rfl:    Pertinent family medical history: family history includes Breast cancer in her maternal aunt; Colon polyps in  her mother; Heart disease in her father; Hiatal hernia in her mother; Ovarian cancer in her sister; Stroke in her mother; Ulcers in her father.   Allergies  Allergen Reactions  . Doxycycline Other (See Comments)    Pt get stomach cramps  . E-Mycin [Erythromycin Base]   . Septra [Bactrim]   . Sulfa Antibiotics Other (See Comments)    Pt throat closes up  . Sulfamethoxazole-Trimethoprim     Social History   Socioeconomic History  . Marital status: Married    Spouse name: Not on file  . Number of children: 1  . Years of education: Not on file  . Highest education level: Not on file  Occupational History  . Occupation: cma  Social Needs  . Financial resource strain: Not on file  . Food insecurity:    Worry: Not on file    Inability: Not on file  . Transportation needs:    Medical: Not on file    Non-medical: Not on file  Tobacco Use  . Smoking status: Never Smoker  . Smokeless tobacco: Never Used  Substance and Sexual Activity  . Alcohol use: No  . Drug use: No  . Sexual activity: Not on file  Lifestyle  . Physical activity:    Days per week: Not on file    Minutes per session: Not on file  . Stress: Not on file  Relationships  . Social connections:    Talks on phone: Not on file    Gets together: Not on file    Attends religious service:  Not on file    Active member of club or organization: Not on file    Attends meetings of clubs or organizations: Not on file    Relationship status: Not on file  . Intimate partner violence:    Fear of current or ex partner: Not on file    Emotionally abused: Not on file    Physically abused: Not on file    Forced sexual activity: Not on file  Other Topics Concern  . Not on file  Social History Narrative   Daily caffeine     Review of Systems: Pertinent negatives listed in HPI Objective:   Vitals:   07/22/18 1439  BP: 103/64  Pulse: 75  Temp: 98.8 F (37.1 C)  SpO2: 98%    BP Readings from Last 3 Encounters:   07/22/18 103/64  10/27/14 104/60  12/05/13 100/70    Filed Weights   07/22/18 1439  Weight: 158 lb (71.7 kg)      Physical Exam: Constitutional: Patient appears well-developed and well-nourished. No distress. HENT: Normocephalic, atraumatic, External right and left ear normal. Oropharynx is clear and moist.  Eyes: Conjunctivae and EOM are normal. PERRLA, no scleral icterus. Neck: Normal ROM. Neck supple. No JVD. No tracheal deviation. No thyromegaly. CVS: RRR, S1/S2 +, no murmurs, no gallops, no carotid bruit.  Pulmonary: Effort and breath sounds normal, no stridor, rhonchi, wheezes, rales.  Abdominal: Soft. BS +, no distension, tenderness, rebound or guarding.  Musculoskeletal: Normal range of motion. No edema and no tenderness.  Neuro: Alert. Normal muscle tone coordination. Normal gait. BUE and BLE strength 5/5. Bilateral hand grips symmetrical. No cranial nerve deficit. Skin: Skin is warm and dry. No rash noted. Not diaphoretic. No erythema. No pallor. Psychiatric: Normal mood and affect. Behavior, judgment, thought content normal.  Lab Results (prior encounters)  Lab Results  Component Value Date   WBC 5.1 04/01/2013   HGB 13.2 04/01/2013   HCT 42.2 04/01/2013   MCV 98.0 (A) 04/01/2013   PLT 273 01/24/2007   Lab Results  Component Value Date   CREATININE 0.8 05/16/2009   BUN 8 05/16/2009   NA 138 05/16/2009   K 3.5 05/16/2009   CL 104 05/16/2009   CO2 26 01/24/2007       Assessment and plan:  1. Encounter to establish care  2. Screening for blood or protein in urine - POCT URINALYSIS DIP (CLINITEK)  3. Hypothyroidism, unspecified type - Thyroid Panel With TSH  4. Wellness examination,   -Hemoglobin A1c  5. Hypercholesterolemia -- Lipid panel; Future, will obtain at a later date. Patient is not fasting.  6. Acute pain of left knee - Vitamin D, 25-hydroxy - DG Knee Complete 4 Views    7. Fibroid, hx  - Iron, TIBC and Ferritin Panel - CBC with  Differential   Return in about 6 months (around 01/21/2019) for Thyroid and fatigue .   The patient was given clear instructions to go to ER or return to medical center if symptoms don't improve, worsen or new problems develop. The patient verbalized understanding. The patient was advised  to call and obtain lab results if they haven't heard anything from out office within 7-10 business days.  Molli Barrows, FNP [facility] [address], [place] [address] [phone]: [phone]    This note has been created with Dragon speech recognition software and Engineer, materials. Any transcriptional errors are unintentional.

## 2018-07-22 NOTE — Progress Notes (Deleted)
Left knee pain  Est Care, thyroid

## 2018-07-22 NOTE — Patient Instructions (Addendum)
Thank you for choosing [facility] to be your medical home!    Sally Mitchell was seen by Kimberly Harris, FNP today.   Sally Mitchell's primary care provider is Harris, Kimberly S, FNP.   For the best care possible, you should try to see Kimberly Harris, FNP-C whenever you come to the clinic.   We look forward to seeing you again soon!  If you have any questions about your visit today, please call us at [phone] or feel free to reach your primary care provider via MyChart.     Fatigue If you have fatigue, you feel tired all the time and have a lack of energy or a lack of motivation. Fatigue may make it difficult to start or complete tasks because of exhaustion. In general, occasional or mild fatigue is often a normal response to activity or life. However, long-lasting (chronic) or extreme fatigue may be a symptom of a medical condition. Follow these instructions at home: General instructions  Watch your fatigue for any changes.  Go to bed and get up at the same time every day.  Avoid fatigue by pacing yourself during the day and getting enough sleep at night.  Maintain a healthy weight. Medicines  Take over-the-counter and prescription medicines only as told by your health care provider.  Take a multivitamin, if told by your health care provider.  Do not use herbal or dietary supplements unless they are approved by your health care provider. Activity   Exercise regularly, as told by your health care provider.  Use or practice techniques to help you relax, such as yoga, tai chi, meditation, or massage therapy. Eating and drinking   Avoid heavy meals in the evening.  Eat a well-balanced diet, which includes lean proteins, whole grains, plenty of fruits and vegetables, and low-fat dairy products.  Avoid consuming too much caffeine.  Avoid the use of alcohol.  Drink enough fluid to keep your urine pale yellow. Lifestyle  Change situations that  cause you stress. Try to keep your work and personal schedule in balance.  Do not use any products that contain nicotine or tobacco, such as cigarettes and e-cigarettes. If you need help quitting, ask your health care provider.  Do not use drugs. Contact a health care provider if:  Your fatigue does not get better.  You have a fever.  You suddenly lose or gain weight.  You have headaches.  You have trouble falling asleep or sleeping through the night.  You feel angry, guilty, anxious, or sad.  You are unable to have a bowel movement (constipation).  Your skin is dry.  You have swelling in your legs or another part of your body. Get help right away if:  You feel confused.  Your vision is blurry.  You feel faint or you pass out.  You have a severe headache.  You have severe pain in your abdomen, your back, or the area between your waist and hips (pelvis).  You have chest pain, shortness of breath, or an irregular or fast heartbeat.  You are unable to urinate, or you urinate less than normal.  You have abnormal bleeding, such as bleeding from the rectum, vagina, nose, lungs, or nipples.  You vomit blood.  You have thoughts about hurting yourself or others. If you ever feel like you may hurt yourself or others, or have thoughts about taking your own life, get help right away. You can go to your nearest emergency department or call:    Your local emergency services (911 in the [place]  A suicide crisis helpline, such as the [place] at 249-079-0592. This is open 24 hours a day. Summary  If you have fatigue, you feel tired all the time and have a lack of energy or a lack of motivation.  Fatigue may make it difficult to start or complete tasks because of exhaustion.  Long-lasting (chronic) or extreme fatigue may be a symptom of a medical condition.  Exercise regularly, as told by your health care provider.  Change situations that cause  you stress. Try to keep your work and personal schedule in balance. This information is not intended to replace advice given to you by your health care provider. Make sure you discuss any questions you have with your health care provider. Document Released: 12/04/2006 Document Revised: 11/01/2016 Document Reviewed: 11/01/2016 Elsevier Interactive Patient Education  2019 [place].     Acute Knee Pain, Adult Many things can cause knee pain. Sometimes, knee pain is sudden (acute) and may be caused by damage, swelling, or irritation of the muscles and tissues that support your knee. The pain often goes away on its own with time and rest. If the pain does not go away, tests may be done to find out what is causing the pain. Follow these instructions at home: Pay attention to any changes in your symptoms. Take these actions to relieve your pain. If you have a knee sleeve or brace:   Wear the sleeve or brace as told by your doctor. Remove it only as told by your doctor.  Loosen the sleeve or brace if your toes: ? Tingle. ? Become numb. ? Turn cold and blue.  Keep the sleeve or brace clean.  If the sleeve or brace is not waterproof: ? Do not let it get wet. ? Cover it with a watertight covering when you take a bath or shower. Activity  Rest your knee.  Do not do things that cause pain.  Avoid activities where both feet leave the ground at the same time (high-impact activities). Examples are running, jumping rope, and doing jumping jacks.  Work with a physical therapist to make a safe exercise program, as told by your doctor. Managing pain, stiffness, and swelling   If told, put ice on the knee: ? Put ice in a plastic bag. ? Place a towel between your skin and the bag. ? Leave the ice on for 20 minutes, 2-3 times a day.  If told, put pressure (compression) on your injured knee to control swelling, give support, and help with discomfort. Compression may be done with an elastic  bandage. General instructions  Take all medicines only as told by your doctor.  Raise (elevate) your knee while you are sitting or lying down. Make sure your knee is higher than your heart.  Sleep with a pillow under your knee.  Do not use any products that contain nicotine or tobacco. These include cigarettes, e-cigarettes, and chewing tobacco. These products may slow down healing. If you need help quitting, ask your doctor.  If you are overweight, work with your doctor and a food expert (dietitian) to set goals to lose weight. Being overweight can make your knee hurt more.  Keep all follow-up visits as told by your doctor. This is important. Contact a doctor if:  The knee pain does not stop.  The knee pain changes or gets worse.  You have a fever along with knee pain.  Your knee feels warm when  you touch it.  Your knee gives out or locks up. Get help right away if:  Your knee swells, and the swelling gets worse.  You cannot move your knee.  You have very bad knee pain. Summary  Many things can cause knee pain. The pain often goes away on its own with time and rest.  Your doctor may do tests to find out the cause of the pain.  Pay attention to any changes in your symptoms. Relieve your pain with rest, medicines, light activity, and use of ice.  Get help right away if you cannot move your knee or your knee pain is very bad. This information is not intended to replace advice given to you by your health care provider. Make sure you discuss any questions you have with your health care provider. Document Released: 05/05/2008 Document Revised: 07/19/2017 Document Reviewed: 07/19/2017 Elsevier Interactive Patient Education  2019 Elsevier Inc.  

## 2018-07-23 LAB — VITAMIN D 25 HYDROXY (VIT D DEFICIENCY, FRACTURES): Vit D, 25-Hydroxy: 40.8 ng/mL (ref 30.0–100.0)

## 2018-07-23 LAB — CBC WITH DIFFERENTIAL/PLATELET
Basophils Absolute: 0 10*3/uL (ref 0.0–0.2)
Basos: 1 %
EOS (ABSOLUTE): 0.1 10*3/uL (ref 0.0–0.4)
Eos: 2 %
Hematocrit: 40.7 % (ref 34.0–46.6)
Hemoglobin: 13.5 g/dL (ref 11.1–15.9)
Immature Grans (Abs): 0 10*3/uL (ref 0.0–0.1)
Immature Granulocytes: 0 %
Lymphocytes Absolute: 1.9 10*3/uL (ref 0.7–3.1)
Lymphs: 41 %
MCH: 32.1 pg (ref 26.6–33.0)
MCHC: 33.2 g/dL (ref 31.5–35.7)
MCV: 97 fL (ref 79–97)
Monocytes Absolute: 0.4 10*3/uL (ref 0.1–0.9)
Monocytes: 9 %
Neutrophils Absolute: 2.2 10*3/uL (ref 1.4–7.0)
Neutrophils: 47 %
Platelets: 212 10*3/uL (ref 150–450)
RBC: 4.21 x10E6/uL (ref 3.77–5.28)
RDW: 12.3 % (ref 11.7–15.4)
WBC: 4.7 10*3/uL (ref 3.4–10.8)

## 2018-07-23 LAB — IRON,TIBC AND FERRITIN PANEL
Ferritin: 65 ng/mL (ref 15–150)
Iron Saturation: 24 % (ref 15–55)
Iron: 72 ug/dL (ref 27–159)
Total Iron Binding Capacity: 304 ug/dL (ref 250–450)
UIBC: 232 ug/dL (ref 131–425)

## 2018-07-23 LAB — THYROID PANEL WITH TSH
Free Thyroxine Index: 2.9 (ref 1.2–4.9)
T3 Uptake Ratio: 25 % (ref 24–39)
T4, Total: 11.5 ug/dL (ref 4.5–12.0)
TSH: 1.25 u[IU]/mL (ref 0.450–4.500)

## 2018-07-23 LAB — HEMOGLOBIN A1C
Est. average glucose Bld gHb Est-mCnc: 94 mg/dL
Hgb A1c MFr Bld: 4.9 % (ref 4.8–5.6)

## 2018-09-30 DIAGNOSIS — H5213 Myopia, bilateral: Secondary | ICD-10-CM | POA: Diagnosis not present

## 2018-10-11 ENCOUNTER — Other Ambulatory Visit: Payer: Self-pay

## 2018-10-11 ENCOUNTER — Ambulatory Visit: Payer: 59 | Attending: Family Medicine

## 2018-10-22 MED FILL — SYNTHROID 112 MCG TABLET: 112 | 90 days supply | Qty: 90 | Fill #1

## 2018-12-24 ENCOUNTER — Other Ambulatory Visit: Payer: Self-pay | Admitting: Family Medicine

## 2018-12-24 DIAGNOSIS — Z1231 Encounter for screening mammogram for malignant neoplasm of breast: Secondary | ICD-10-CM

## 2019-01-20 MED FILL — SYNTHROID 112 MCG TABLET: 112 | 90 days supply | Qty: 90 | Fill #2

## 2019-01-21 ENCOUNTER — Ambulatory Visit: Payer: 59

## 2019-01-22 ENCOUNTER — Other Ambulatory Visit: Payer: Self-pay

## 2019-01-22 DIAGNOSIS — Z20822 Contact with and (suspected) exposure to covid-19: Secondary | ICD-10-CM

## 2019-01-23 ENCOUNTER — Other Ambulatory Visit: Payer: Self-pay

## 2019-01-23 ENCOUNTER — Ambulatory Visit
Admission: RE | Admit: 2019-01-23 | Discharge: 2019-01-23 | Disposition: A | Discharge status: Home / Self Care | Payer: 59 | Source: Ambulatory Visit | Attending: Family Medicine | Admitting: Family Medicine

## 2019-01-23 DIAGNOSIS — Z1231 Encounter for screening mammogram for malignant neoplasm of breast: Secondary | ICD-10-CM | POA: Diagnosis not present

## 2019-01-24 ENCOUNTER — Other Ambulatory Visit: Payer: Self-pay | Admitting: Family Medicine

## 2019-01-24 ENCOUNTER — Telehealth: Payer: Self-pay | Admitting: General Practice

## 2019-01-24 DIAGNOSIS — R928 Other abnormal and inconclusive findings on diagnostic imaging of breast: Secondary | ICD-10-CM

## 2019-01-24 LAB — NOVEL CORONAVIRUS, NAA: SARS-CoV-2, NAA: NOT DETECTED

## 2019-01-24 NOTE — Telephone Encounter (Signed)
Negative COVID results given. Patient results "NOT Detected." Caller expressed understanding. ° °

## 2019-01-30 ENCOUNTER — Ambulatory Visit
Admission: RE | Admit: 2019-01-30 | Discharge: 2019-01-30 | Disposition: A | Discharge status: Home / Self Care | Payer: 59 | Source: Ambulatory Visit | Attending: Family Medicine | Admitting: Family Medicine

## 2019-01-30 ENCOUNTER — Other Ambulatory Visit: Payer: Self-pay

## 2019-01-30 DIAGNOSIS — R928 Other abnormal and inconclusive findings on diagnostic imaging of breast: Secondary | ICD-10-CM

## 2019-01-30 DIAGNOSIS — R922 Inconclusive mammogram: Secondary | ICD-10-CM | POA: Diagnosis not present

## 2019-01-30 DIAGNOSIS — N6324 Unspecified lump in the left breast, lower inner quadrant: Secondary | ICD-10-CM | POA: Diagnosis not present

## 2019-01-30 DIAGNOSIS — N6322 Unspecified lump in the left breast, upper inner quadrant: Secondary | ICD-10-CM | POA: Diagnosis not present

## 2019-02-25 ENCOUNTER — Ambulatory Visit: Payer: 59

## 2019-04-18 ENCOUNTER — Other Ambulatory Visit: Payer: Self-pay | Admitting: Family Medicine

## 2019-04-18 NOTE — Telephone Encounter (Signed)
Patient no longer under my care. Agreed to refill for 30 days, patient will need to schedule a follow-up with Dr. Juleen China at [facility].

## 2019-04-19 MED FILL — SYNTHROID 112 MCG TABLET: 112 | 30 days supply | Qty: 30 | Fill #0

## 2019-04-22 DIAGNOSIS — Z6827 Body mass index (BMI) 27.0-27.9, adult: Secondary | ICD-10-CM | POA: Diagnosis not present

## 2019-04-22 DIAGNOSIS — N920 Excessive and frequent menstruation with regular cycle: Secondary | ICD-10-CM | POA: Diagnosis not present

## 2019-04-22 DIAGNOSIS — D259 Leiomyoma of uterus, unspecified: Secondary | ICD-10-CM | POA: Diagnosis not present

## 2019-04-22 DIAGNOSIS — Z124 Encounter for screening for malignant neoplasm of cervix: Secondary | ICD-10-CM | POA: Diagnosis not present

## 2019-04-22 DIAGNOSIS — N368 Other specified disorders of urethra: Secondary | ICD-10-CM | POA: Diagnosis not present

## 2019-04-22 DIAGNOSIS — Z1151 Encounter for screening for human papillomavirus (HPV): Secondary | ICD-10-CM | POA: Diagnosis not present

## 2019-04-22 DIAGNOSIS — Z01419 Encounter for gynecological examination (general) (routine) without abnormal findings: Secondary | ICD-10-CM | POA: Diagnosis not present

## 2019-04-22 DIAGNOSIS — Z862 Personal history of diseases of the blood and blood-forming organs and certain disorders involving the immune mechanism: Secondary | ICD-10-CM | POA: Diagnosis not present

## 2019-04-22 DIAGNOSIS — J302 Other seasonal allergic rhinitis: Secondary | ICD-10-CM | POA: Insufficient documentation

## 2019-04-22 DIAGNOSIS — Z1389 Encounter for screening for other disorder: Secondary | ICD-10-CM | POA: Diagnosis not present

## 2019-04-23 DIAGNOSIS — Z1151 Encounter for screening for human papillomavirus (HPV): Secondary | ICD-10-CM | POA: Diagnosis not present

## 2019-04-23 DIAGNOSIS — Z124 Encounter for screening for malignant neoplasm of cervix: Secondary | ICD-10-CM | POA: Diagnosis not present

## 2019-04-23 MED FILL — FERREX 150 CAPSULE: 150 | 30 days supply | Qty: 30 | Fill #0

## 2019-05-08 DIAGNOSIS — M545 Low back pain: Secondary | ICD-10-CM | POA: Diagnosis not present

## 2019-05-08 DIAGNOSIS — D649 Anemia, unspecified: Secondary | ICD-10-CM | POA: Diagnosis not present

## 2019-05-08 DIAGNOSIS — N920 Excessive and frequent menstruation with regular cycle: Secondary | ICD-10-CM | POA: Diagnosis not present

## 2019-05-08 DIAGNOSIS — D259 Leiomyoma of uterus, unspecified: Secondary | ICD-10-CM | POA: Diagnosis not present

## 2019-05-20 ENCOUNTER — Telehealth: Payer: Self-pay | Admitting: Family Medicine

## 2019-05-20 MED ORDER — LEVOTHYROXINE SODIUM 112 MCG PO TABS: ORAL_TABLET | ORAL | 0 refills | Status: DC

## 2019-05-20 MED FILL — SYNTHROID 112 MCG TABLET: 112 | 30 days supply | Qty: 30 | Fill #0

## 2019-05-20 NOTE — Telephone Encounter (Signed)
Rx sent 

## 2019-05-20 NOTE — Telephone Encounter (Signed)
1) Medication(s) Requested (by name): SYNTHROID 112 MCG tablet [id number]   2) Pharmacy of Choice: [facility], [place]  [place], [place] [address]     Approved medications will be sent to pharmacy, we will reach out to you if there is an issue.  Requests made after 3pm may not be addressed until following business day!

## 2019-06-24 ENCOUNTER — Other Ambulatory Visit: Payer: Self-pay

## 2019-06-24 MED ORDER — LEVOTHYROXINE SODIUM 112 MCG PO TABS: ORAL_TABLET | ORAL | 0 refills | Status: DC

## 2019-06-24 MED FILL — SYNTHROID 112 MCG TABLET: 112 | 30 days supply | Qty: 30 | Fill #0

## 2019-06-25 ENCOUNTER — Telehealth: Payer: Self-pay | Admitting: Family Medicine

## 2019-06-25 ENCOUNTER — Other Ambulatory Visit: Payer: Self-pay | Admitting: Internal Medicine

## 2019-06-25 MED ORDER — FLUOCINOLONE ACETONIDE BODY 0.01 % EX OIL: TOPICAL_OIL | CUTANEOUS | 2 refills | Status: DC

## 2019-06-25 NOTE — Telephone Encounter (Signed)
1) Medication(s) Requested (by name):Fluocinolone AcetonideDERMA-SMOOTHE scalp oil per pharmacy     2) Pharmacy of Choice: [facility], [place]   3) Special Requests:   Approved medications will be sent to the pharmacy, we will reach out if there is an issue.  Requests made after 3pm may not be addressed until the following business day!  If a patient is unsure of the name of the medication(s) please note and ask patient to call back when they are able to provide all info, do not send to responsible party until all information is available!

## 2019-06-27 ENCOUNTER — Other Ambulatory Visit: Payer: Self-pay | Admitting: Internal Medicine

## 2019-06-27 MED ORDER — FLUOCINOLONE ACETONIDE SCALP 0.01 % EX OIL: 1.0000 "application " | TOPICAL_OIL | Freq: Two times a day (BID) | CUTANEOUS | 1 refills | Status: DC | PRN

## 2019-06-27 MED FILL — FLUOCINOLONE ACETONIDE SCAL: 0.01 | 20 days supply | Qty: 118 | Fill #0

## 2019-06-27 NOTE — Telephone Encounter (Signed)
Please send the Scalp oil not body oil to the [facility]

## 2019-07-02 ENCOUNTER — Telehealth (INDEPENDENT_AMBULATORY_CARE_PROVIDER_SITE_OTHER): Payer: 59 | Admitting: Internal Medicine

## 2019-07-02 ENCOUNTER — Encounter: Payer: Self-pay | Admitting: Internal Medicine

## 2019-07-02 ENCOUNTER — Other Ambulatory Visit (INDEPENDENT_AMBULATORY_CARE_PROVIDER_SITE_OTHER): Payer: 59

## 2019-07-02 ENCOUNTER — Other Ambulatory Visit: Payer: Self-pay

## 2019-07-02 DIAGNOSIS — Z13228 Encounter for screening for other metabolic disorders: Secondary | ICD-10-CM

## 2019-07-02 DIAGNOSIS — E039 Hypothyroidism, unspecified: Secondary | ICD-10-CM | POA: Diagnosis not present

## 2019-07-02 DIAGNOSIS — E7800 Pure hypercholesterolemia, unspecified: Secondary | ICD-10-CM | POA: Diagnosis not present

## 2019-07-02 DIAGNOSIS — R5383 Other fatigue: Secondary | ICD-10-CM

## 2019-07-02 NOTE — Progress Notes (Signed)
Virtual Visit via Telephone Note  I connected with Sally Mitchell, on 07/02/2019 at 8:57 AM by telephone due to the COVID-19 pandemic and verified that I am speaking with the correct person using two identifiers.   Consent: I discussed the limitations, risks, security and privacy concerns of performing an evaluation and management service by telephone and the availability of in person appointments. I also discussed with the patient that there may be a patient responsible charge related to this service. The patient expressed understanding and agreed to proceed.   Location of Patient: Home   Location of Provider: Clinic    Persons participating in Telemedicine visit: Seniqua Gilliard [place] [facility] Dr. Juleen China      History of Present Illness: Patient has a visit to f/u on chronic medical conditions.   Patient takes Fe 325 mg once per day. Reports that she has a history of anemia from a fibroid. She thinks she is going to have to have hysterectomy.   Hypothyroidism: Takes Synthroid 112 mcg. Reports compliance. She does endorse some fatigue, changes in her hair/nails. Has been noticing them for a couple a months.   She believes she has had high cholesterol in the past. She has never been on a statin. Does have a family history of cholesterol.     Past Medical History:  Diagnosis Date  . Anemia   . Eczema    seborrhecic dermatitis  . FH: breast cancer   . Fibroids, submucosal   . GERD (gastroesophageal reflux disease)   . H/O dysmenorrhea   . H/O fatigue   . H/O hematuria   . H/O menorrhagia   . Hair loss   . Hiatal hernia   . History of kidney stones   . Hypercholesterolemia    mild  . Hypopotassemia   . Hypothyroidism   . Insomnia   . Iron deficiency anemia   . Irregular menses    Allergies  Allergen Reactions  . Doxycycline Other (See Comments)    Pt get stomach cramps  . E-Mycin [Erythromycin Base]   . Septra [Bactrim]   . Sulfa Antibiotics Other (See  Comments)    Pt throat closes up  . Sulfamethoxazole-Trimethoprim     Current Outpatient Medications on File Prior to Visit  Medication Sig Dispense Refill  . Cholecalciferol (VITAMIN D3 PO) Take 2,000 Units by mouth daily.    Marland Kitchen FERREX 150 150 MG capsule Take 150 mg by mouth daily.    . Fluocinolone Acetonide Scalp 0.01 % OIL Apply 1 application topically 2 (two) times daily as needed. 118.28 mL 1  . fluticasone (FLONASE) 50 MCG/ACT nasal spray Place 1-2 sprays into both nostrils daily.    Marland Kitchen levothyroxine (SYNTHROID) 112 MCG tablet TAKE 1 TABLET BY MOUTH ONCE DAILY BEFORE BREAKFAST 30 tablet 0   No current facility-administered medications on file prior to visit.    Observations/Objective: NAD. Speaking clearly.  Work of breathing normal.  Alert and oriented. Mood appropriate.   Assessment and Plan: 1. Hypothyroidism, unspecified type TSH 1.25 (June 2020). Has some symptoms that could be concerning for thyroid abnormality. Will repeat TSH and adjust Synthroid as appropriate.  - TSH; Future  2. Hypercholesterolemia Reports personal and family history of HLD. I do not see a prior lipid panel. Will check and calculate ASCVD risk.  - Lipid Panel; Future  3. Screening for metabolic disorder - Comprehensive metabolic panel; Future  4. Other fatigue Will investigate for other lab abnormalities aside from thyroid that could cause fatigue.  Patient does report anemia from fibroid and is currently taking an Fe supplement.  - Vitamin B12 - VITAMIN D 25 Hydroxy (Vit-D Deficiency, Fractures) - CBC with Differential; Future   Follow Up Instructions: Lab visit today    I discussed the assessment and treatment plan with the patient. The patient was provided an opportunity to ask questions and all were answered. The patient agreed with the plan and demonstrated an understanding of the instructions.   The patient was advised to call back or seek an in-person evaluation if the symptoms  worsen or if the condition fails to improve as anticipated.     I provided 14 minutes total of non-face-to-face time during this encounter including median intraservice time, reviewing previous notes, investigations, ordering medications, medical decision making, coordinating care and patient verbalized understanding at the end of the visit.    Phill Myron, D.O. Primary Care at [facility]  07/02/2019, 8:57 AM

## 2019-07-03 LAB — COMPREHENSIVE METABOLIC PANEL
ALT: 8 IU/L (ref 0–32)
AST: 15 IU/L (ref 0–40)
Albumin/Globulin Ratio: 1.6 (ref 1.2–2.2)
Albumin: 4.5 g/dL (ref 3.8–4.8)
Alkaline Phosphatase: 53 IU/L (ref 39–117)
BUN/Creatinine Ratio: 15 (ref 9–23)
BUN: 11 mg/dL (ref 6–24)
Bilirubin Total: 1.4 mg/dL — ABNORMAL HIGH (ref 0.0–1.2)
CO2: 22 mmol/L (ref 20–29)
Calcium: 9.4 mg/dL (ref 8.7–10.2)
Chloride: 102 mmol/L (ref 96–106)
Creatinine, Ser: 0.72 mg/dL (ref 0.57–1.00)
GFR calc Af Amer: 114 mL/min/{1.73_m2} (ref >59)
GFR calc non Af Amer: 99 mL/min/{1.73_m2} (ref >59)
Globulin, Total: 2.9 g/dL (ref 1.5–4.5)
Glucose: 83 mg/dL (ref 65–99)
Potassium: 3.9 mmol/L (ref 3.5–5.2)
Sodium: 136 mmol/L (ref 134–144)
Total Protein: 7.4 g/dL (ref 6.0–8.5)

## 2019-07-03 LAB — CBC WITH DIFFERENTIAL/PLATELET
Basophils Absolute: 0 10*3/uL (ref 0.0–0.2)
Basos: 1 %
EOS (ABSOLUTE): 0.1 10*3/uL (ref 0.0–0.4)
Eos: 1 %
Hematocrit: 40.5 % (ref 34.0–46.6)
Hemoglobin: 13.5 g/dL (ref 11.1–15.9)
Immature Grans (Abs): 0 10*3/uL (ref 0.0–0.1)
Immature Granulocytes: 0 %
Lymphocytes Absolute: 1.5 10*3/uL (ref 0.7–3.1)
Lymphs: 37 %
MCH: 31 pg (ref 26.6–33.0)
MCHC: 33.3 g/dL (ref 31.5–35.7)
MCV: 93 fL (ref 79–97)
Monocytes Absolute: 0.3 10*3/uL (ref 0.1–0.9)
Monocytes: 7 %
Neutrophils Absolute: 2.2 10*3/uL (ref 1.4–7.0)
Neutrophils: 54 %
Platelets: 185 10*3/uL (ref 150–450)
RBC: 4.35 x10E6/uL (ref 3.77–5.28)
RDW: 12.4 % (ref 11.7–15.4)
WBC: 4 10*3/uL (ref 3.4–10.8)

## 2019-07-03 LAB — LIPID PANEL
Chol/HDL Ratio: 3.4 ratio (ref 0.0–4.4)
Cholesterol, Total: 213 mg/dL — ABNORMAL HIGH (ref 100–199)
HDL: 62 mg/dL (ref >39)
LDL Chol Calc (NIH): 141 mg/dL — ABNORMAL HIGH (ref 0–99)
Triglycerides: 57 mg/dL (ref 0–149)
VLDL Cholesterol Cal: 10 mg/dL (ref 5–40)

## 2019-07-03 LAB — TSH: TSH: 2.17 u[IU]/mL (ref 0.450–4.500)

## 2019-07-03 LAB — VITAMIN B12: Vitamin B-12: 215 pg/mL — ABNORMAL LOW (ref 232–1245)

## 2019-07-03 LAB — VITAMIN D 25 HYDROXY (VIT D DEFICIENCY, FRACTURES): Vit D, 25-Hydroxy: 28.4 ng/mL — ABNORMAL LOW (ref 30.0–100.0)

## 2019-07-04 NOTE — Progress Notes (Signed)
Patient notified of results & recommendations. Expressed understanding. Patient is agreeable to starting B12 supplement.  Patient states that she is already taking 2000 IU of Vitamin D daily. She wants to know if she should increase amount.

## 2019-07-28 ENCOUNTER — Telehealth: Payer: Self-pay | Admitting: Internal Medicine

## 2019-07-28 NOTE — Telephone Encounter (Signed)
1) Medication(s) Requested (by name):levothyroxine (SYNTHROID) 112 MCG tablet [[id number]]    2) [facility] [place] [place]  [address]  3) Special Requests:   Approved medications will be sent to the pharmacy, we will reach out if there is an issue.  Requests made after 3pm may not be addressed until the following business day!  If a patient is unsure of the name of the medication(s) please note and ask patient to call back when they are able to provide all info, do not send to responsible party until all information is available!

## 2019-07-29 ENCOUNTER — Other Ambulatory Visit: Payer: Self-pay | Admitting: Internal Medicine

## 2019-07-29 MED ORDER — LEVOTHYROXINE SODIUM 112 MCG PO TABS: ORAL_TABLET | ORAL | 1 refills | Status: DC

## 2019-08-26 ENCOUNTER — Other Ambulatory Visit: Payer: Self-pay | Admitting: Internal Medicine

## 2019-08-26 ENCOUNTER — Telehealth: Payer: Self-pay | Admitting: Internal Medicine

## 2019-08-26 MED ORDER — LEVOTHYROXINE SODIUM 112 MCG PO TABS: ORAL_TABLET | ORAL | 1 refills | Status: DC

## 2019-08-26 MED FILL — SYNTHROID 112 MCG TABLET: 112 | 90 days supply | Qty: 90 | Fill #0

## 2019-08-26 NOTE — Telephone Encounter (Signed)
1) Medication(s) Requested (by name): levothyroxine (SYNTHROID) 112 MCG tablet   2) Pharmacy of Choice: [facility], [place]   3) Special Requests: Patient states the pharmacy never received the medication.    Approved medications will be sent to the pharmacy, we will reach out if there is an issue.  Requests made after 3pm may not be addressed until the following business day!  If a patient is unsure of the name of the medication(s) please note and ask patient to call back when they are able to provide all info, do not send to responsible party until all information is available!

## 2019-10-15 ENCOUNTER — Other Ambulatory Visit: Payer: Self-pay | Admitting: Adult Health

## 2019-10-15 MED ORDER — CYCLOBENZAPRINE HCL 10 MG PO TABS: 10.0000 mg | ORAL_TABLET | Freq: Every day | ORAL | 0 refills | Status: DC

## 2019-10-15 MED FILL — CYCLOBENZAPRINE HCL 10 MG T: 10 | 15 days supply | Qty: 15 | Fill #0

## 2019-11-19 DIAGNOSIS — H5213 Myopia, bilateral: Secondary | ICD-10-CM | POA: Diagnosis not present

## 2019-11-22 MED FILL — SYNTHROID 112 MCG TABLET: 112 | 90 days supply | Qty: 90 | Fill #1

## 2019-12-25 ENCOUNTER — Telehealth (INDEPENDENT_AMBULATORY_CARE_PROVIDER_SITE_OTHER): Payer: 59 | Admitting: Physician Assistant

## 2019-12-25 ENCOUNTER — Other Ambulatory Visit: Payer: Self-pay

## 2019-12-25 DIAGNOSIS — R5383 Other fatigue: Secondary | ICD-10-CM | POA: Diagnosis not present

## 2019-12-25 DIAGNOSIS — E039 Hypothyroidism, unspecified: Secondary | ICD-10-CM

## 2019-12-25 DIAGNOSIS — R002 Palpitations: Secondary | ICD-10-CM | POA: Diagnosis not present

## 2019-12-25 NOTE — Progress Notes (Signed)
Patient verified DOB Patient complains of feeling fatigue for the past month. Patient complains of palpitations randomly coming intermittently. Patient denies any chocolate or caffeine intake which causes this concern. Patient expresses it happening while at rest 3 days ago and then in [place] yesterday while carrying groceries in her arms. Patient denies any chest pain.

## 2019-12-25 NOTE — Progress Notes (Addendum)
Established Patient Office Visit  Subjective:  Patient ID: Sally Mitchell, female    DOB: 1970/12/29  Age: 49 y.o. MRN: [id number]  CC:  Chief Complaint  Patient presents with  . Fatigue   Virtual Visit via Telephone Note  I connected with Sally Mitchell on 12/25/19 at  2:30 PM EDT by telephone and verified that I am speaking with the correct person using two identifiers.  Location: Patient: Work Provider: [facility]   I discussed the limitations, risks, security and privacy concerns of performing an evaluation and management service by telephone and the availability of in person appointments. I also discussed with the patient that there may be a patient responsible charge related to this service. The patient expressed understanding and agreed to proceed.   History of Present Illness:   Sally Mitchell reports that she has been having fatigue for the past 3 weeks, states that she feels like she could fall asleep at any time despite sleeping well at night, is sleeping approximately 7 hours  Reports that she is taking B12 as directed.  Denies depressed mood, no new stressors.  Does endorse her menses has been heavier than normal, last menstrual cycle 12/16/2019  Reports that she has been having intermittent episodes of heart palpitations.  Reports that they will last up to a minute, denies shortness of breath or chest pain.  Reports they have happened at rest with last episode happening yesterday as she was carrying groceries out of [place].     Observations/Objective: Medical history and current medications reviewed, no physical exam completed    Past Medical History:  Diagnosis Date  . Anemia   . Eczema    seborrhecic dermatitis  . FH: breast cancer   . Fibroids, submucosal   . GERD (gastroesophageal reflux disease)   . H/O dysmenorrhea   . H/O fatigue   . H/O hematuria   . H/O menorrhagia   . Hair loss   . Hiatal hernia   . History of kidney  stones   . Hypercholesterolemia    mild  . Hypopotassemia   . Hypothyroidism   . Insomnia   . Iron deficiency anemia   . Irregular menses     Past Surgical History:  Procedure Laterality Date  . CESAREAN SECTION    . DILATION AND CURETTAGE OF UTERUS    . HYSTEROSCOPY  03/31/2004   with resection of endometrial fibroid  . WISDOM TOOTH EXTRACTION      Family History  Problem Relation Age of Onset  . Stroke Mother   . Hiatal hernia Mother   . Colon polyps Mother   . Heart disease Father   . Ulcers Father   . Breast cancer Maternal Aunt   . Ovarian cancer Sister   . Colon cancer Neg Hx     Social History   Socioeconomic History  . Marital status: Married    Spouse name: Not on file  . Number of children: 1  . Years of education: Not on file  . Highest education level: Not on file  Occupational History  . Occupation: cma  Tobacco Use  . Smoking status: Never Smoker  . Smokeless tobacco: Never Used  Vaping Use  . Vaping Use: Never used  Substance and Sexual Activity  . Alcohol use: No  . Drug use: No  . Sexual activity: Not on file  Other Topics Concern  . Not on file  Social History Narrative   Daily caffeine  Social Determinants of Health   Financial Resource Strain:   . Difficulty of Paying Living Expenses: Not on file  Food Insecurity:   . Worried About Charity fundraiser in the Last Year: Not on file  . Ran Out of Food in the Last Year: Not on file  Transportation Needs:   . Lack of Transportation (Medical): Not on file  . Lack of Transportation (Non-Medical): Not on file  Physical Activity:   . Days of Exercise per Week: Not on file  . Minutes of Exercise per Session: Not on file  Stress:   . Feeling of Stress : Not on file  Social Connections:   . Frequency of Communication with Friends and Family: Not on file  . Frequency of Social Gatherings with Friends and Family: Not on file  . Attends Religious Services: Not on file  . Active Member of  Clubs or Organizations: Not on file  . Attends Archivist Meetings: Not on file  . Marital Status: Not on file  Intimate Partner Violence:   . Fear of Current or Ex-Partner: Not on file  . Emotionally Abused: Not on file  . Physically Abused: Not on file  . Sexually Abused: Not on file    Outpatient Medications Prior to Visit  Medication Sig Dispense Refill  . Cholecalciferol (VITAMIN D3 PO) Take 2,000 Units by mouth daily.    . cyclobenzaprine (FLEXERIL) 10 MG tablet Take 1 tablet (10 mg total) by mouth at bedtime. 15 tablet 0  . FERREX 150 150 MG capsule Take 150 mg by mouth daily.    . Fluocinolone Acetonide Scalp 0.01 % OIL Apply 1 application topically 2 (two) times daily as needed. 118.28 mL 1  . fluticasone (FLONASE) 50 MCG/ACT nasal spray Place 1-2 sprays into both nostrils daily.    Marland Kitchen levothyroxine (SYNTHROID) 112 MCG tablet TAKE 1 TABLET BY MOUTH ONCE DAILY BEFORE BREAKFAST 90 tablet 1   No facility-administered medications prior to visit.    Allergies  Allergen Reactions  . Doxycycline Other (See Comments)    Pt get stomach cramps  . E-Mycin [Erythromycin Base]   . Septra [Bactrim]   . Sulfa Antibiotics Other (See Comments)    Pt throat closes up  . Sulfamethoxazole-Trimethoprim     ROS Review of Systems  Constitutional: Positive for fatigue.  HENT: Negative.   Eyes: Negative.   Respiratory: Negative for cough, shortness of breath and wheezing.   Cardiovascular: Positive for palpitations. Negative for chest pain.  Gastrointestinal: Negative.   Endocrine: Negative.   Genitourinary: Negative.   Musculoskeletal: Negative.   Skin: Negative.   Allergic/Immunologic: Negative.   Neurological: Negative for dizziness, syncope and headaches.  Hematological: Negative.   Psychiatric/Behavioral: Negative for dysphoric mood, self-injury, sleep disturbance and suicidal ideas. The patient is not nervous/anxious.       Objective:     There were no vitals  taken for this visit. Wt Readings from Last 3 Encounters:  07/22/18 158 lb (71.7 kg)  10/27/14 152 lb (68.9 kg)  12/05/13 142 lb 9.6 oz (64.7 kg)     Health Maintenance Due  Topic Date Due  . Hepatitis C Screening  Never done  . HIV Screening  Never done  . TETANUS/TDAP  Never done    There are no preventive care reminders to display for this patient.  Lab Results  Component Value Date   TSH 2.170 07/02/2019   Lab Results  Component Value Date   WBC 4.0 07/02/2019  HGB 13.5 07/02/2019   HCT 40.5 07/02/2019   MCV 93 07/02/2019   PLT 185 07/02/2019   Lab Results  Component Value Date   NA 136 07/02/2019   K 3.9 07/02/2019   CO2 22 07/02/2019   GLUCOSE 83 07/02/2019   BUN 11 07/02/2019   CREATININE 0.72 07/02/2019   BILITOT 1.4 (H) 07/02/2019   ALKPHOS 53 07/02/2019   AST 15 07/02/2019   ALT 8 07/02/2019   PROT 7.4 07/02/2019   ALBUMIN 4.5 07/02/2019   CALCIUM 9.4 07/02/2019   Lab Results  Component Value Date   CHOL 213 (H) 07/02/2019   Lab Results  Component Value Date   HDL 62 07/02/2019   Lab Results  Component Value Date   LDLCALC 141 (H) 07/02/2019   Lab Results  Component Value Date   TRIG 57 07/02/2019   Lab Results  Component Value Date   CHOLHDL 3.4 07/02/2019   Lab Results  Component Value Date   HGBA1C 4.9 07/22/2018      Assessment & Plan:   Problem List Items Addressed This Visit    None      Assessment and Plan: 1. Fatigue, unspecified type Patient [facility] for labs and ECG. The patient was given clear instructions to go to ER or return to medical center if symptoms don't improve, worsen or new problems develop. The patient verbalized understanding. The patient was told to call to get lab results if they haven't heard anything in the next week.   - Thyroid Panel With TSH; Future - CBC with Differential/Platelet; Future - Comp. Metabolic Panel (12); Future  2.  Hypothyroidism, unspecified type  - Thyroid Panel With TSH; Future  3. Palpitations  - EKG 12-Lead; Future  Follow Up Instructions:    I discussed the assessment and treatment plan with the patient. The patient was provided an opportunity to ask questions and all were answered. The patient agreed with the plan and demonstrated an understanding of the instructions.   The patient was advised to call back or seek an in-person evaluation if the symptoms worsen or if the condition fails to improve as anticipated.  I provided 21 minutes of non-face-to-face time during this encounter.   Alta Goding S Mayers, PA-C     No orders of the defined types were placed in this encounter.   Follow-up: No follow-ups on file.    Loraine Grip Mayers, PA-C

## 2019-12-26 ENCOUNTER — Other Ambulatory Visit: Payer: 59

## 2019-12-26 ENCOUNTER — Telehealth: Payer: Self-pay

## 2019-12-26 ENCOUNTER — Ambulatory Visit: Payer: 59

## 2019-12-26 ENCOUNTER — Ambulatory Visit: Payer: 59 | Attending: Family Medicine

## 2019-12-26 ENCOUNTER — Other Ambulatory Visit: Payer: Self-pay

## 2019-12-26 VITALS — HR 61 | Temp 96.8°F

## 2019-12-26 DIAGNOSIS — E039 Hypothyroidism, unspecified: Secondary | ICD-10-CM | POA: Diagnosis not present

## 2019-12-26 DIAGNOSIS — R002 Palpitations: Secondary | ICD-10-CM | POA: Diagnosis not present

## 2019-12-26 DIAGNOSIS — R5383 Other fatigue: Secondary | ICD-10-CM

## 2019-12-26 NOTE — Progress Notes (Signed)
Pt seen in clinic for EKG/labs, EKG completed and uploaded to chart for provider review, labs completed, pt tolerated procedures well, pt aware clinic will follow-up w/ all results.

## 2019-12-26 NOTE — Telephone Encounter (Signed)
Pt missed NV/lab appts this morning, reached out to pt for rescheduling, pt claims she was unaware of appts, will come in now, pt rescheduled for NV/lab @ 2:30p & 3:15p.

## 2019-12-27 LAB — THYROID PANEL WITH TSH
Free Thyroxine Index: 3.5 (ref 1.2–4.9)
T3 Uptake Ratio: 30 % (ref 24–39)
T4, Total: 11.7 ug/dL (ref 4.5–12.0)
TSH: 1.13 u[IU]/mL (ref 0.450–4.500)

## 2019-12-27 LAB — COMP. METABOLIC PANEL (12)
AST: 16 IU/L (ref 0–40)
Albumin/Globulin Ratio: 1.7 (ref 1.2–2.2)
Albumin: 4.3 g/dL (ref 3.8–4.8)
Alkaline Phosphatase: 50 IU/L (ref 44–121)
BUN/Creatinine Ratio: 12 (ref 9–23)
BUN: 12 mg/dL (ref 6–24)
Bilirubin Total: 0.8 mg/dL (ref 0.0–1.2)
Calcium: 9.3 mg/dL (ref 8.7–10.2)
Chloride: 103 mmol/L (ref 96–106)
Creatinine, Ser: 0.99 mg/dL (ref 0.57–1.00)
GFR calc Af Amer: 77 mL/min/{1.73_m2} (ref >59)
GFR calc non Af Amer: 67 mL/min/{1.73_m2} (ref >59)
Globulin, Total: 2.5 g/dL (ref 1.5–4.5)
Glucose: 80 mg/dL (ref 65–99)
Potassium: 4 mmol/L (ref 3.5–5.2)
Sodium: 138 mmol/L (ref 134–144)
Total Protein: 6.8 g/dL (ref 6.0–8.5)

## 2019-12-27 LAB — CBC WITH DIFFERENTIAL/PLATELET
Basophils Absolute: 0 10*3/uL (ref 0.0–0.2)
Basos: 1 %
EOS (ABSOLUTE): 0.1 10*3/uL (ref 0.0–0.4)
Eos: 2 %
Hematocrit: 39.6 % (ref 34.0–46.6)
Hemoglobin: 13.2 g/dL (ref 11.1–15.9)
Immature Grans (Abs): 0 10*3/uL (ref 0.0–0.1)
Immature Granulocytes: 0 %
Lymphocytes Absolute: 1.8 10*3/uL (ref 0.7–3.1)
Lymphs: 38 %
MCH: 31.2 pg (ref 26.6–33.0)
MCHC: 33.3 g/dL (ref 31.5–35.7)
MCV: 94 fL (ref 79–97)
Monocytes Absolute: 0.4 10*3/uL (ref 0.1–0.9)
Monocytes: 8 %
Neutrophils Absolute: 2.5 10*3/uL (ref 1.4–7.0)
Neutrophils: 51 %
Platelets: 217 10*3/uL (ref 150–450)
RBC: 4.23 x10E6/uL (ref 3.77–5.28)
RDW: 11.9 % (ref 11.7–15.4)
WBC: 4.8 10*3/uL (ref 3.4–10.8)

## 2019-12-29 ENCOUNTER — Telehealth: Payer: Self-pay | Admitting: *Deleted

## 2019-12-29 ENCOUNTER — Other Ambulatory Visit: Payer: Self-pay | Admitting: Physician Assistant

## 2019-12-29 DIAGNOSIS — R002 Palpitations: Secondary | ICD-10-CM | POA: Insufficient documentation

## 2019-12-29 NOTE — Telephone Encounter (Signed)
-----   Message from Kennieth Rad, PA-C sent at 12/29/2019  9:00 AM EST ----- Please call patient and let her know that her kidney/liver/thyroid function were WNL.  Hemoglobin WNL as well.  Her ECG did show normal sinus rhythm, but did show a possibly old infarct, this is more than likely from the placement of the leads, however, due to the palpitations if she would like further evaluation, I will be happy to start a referral to cardiology.

## 2019-12-29 NOTE — Telephone Encounter (Signed)
Patient verified DOB Patient is aware of labs and did request a referral to cardiology be placed for further evaluation.

## 2019-12-29 NOTE — Telephone Encounter (Signed)
Medical Assistant left message on patient's home and cell voicemail. Voicemail states to give a call back to [place] with [facility] at [phone]. Patient is aware of labs being normal. Patient is aware of sinus rhythm being normal with a possible old infarct showing. Patient is also aware that this may be due to the placement of leads. Patient was provided the option of a referral to cardiology being placed if she is concerned further.

## 2019-12-29 NOTE — Progress Notes (Signed)
Ref cardiology

## 2020-01-19 ENCOUNTER — Encounter: Payer: Self-pay | Admitting: Internal Medicine

## 2020-01-19 ENCOUNTER — Ambulatory Visit: Payer: 59 | Admitting: Internal Medicine

## 2020-01-19 ENCOUNTER — Other Ambulatory Visit: Payer: Self-pay

## 2020-01-19 VITALS — BP 110/60 | HR 74 | Ht 64.0 in | Wt 158.0 lb

## 2020-01-19 DIAGNOSIS — E785 Hyperlipidemia, unspecified: Secondary | ICD-10-CM | POA: Insufficient documentation

## 2020-01-19 DIAGNOSIS — R002 Palpitations: Secondary | ICD-10-CM

## 2020-01-19 NOTE — Patient Instructions (Signed)
Medication Instructions:  Your physician recommends that you continue on your current medications as directed. Please refer to the Current Medication list given to you today.  *If you need a refill on your cardiac medications before your next appointment, please call your pharmacy*   Lab Work: None   If you have labs (blood work) drawn today and your tests are completely normal, you will receive your results only by: Marland Kitchen MyChart Message (if you have MyChart) OR . A paper copy in the mail If you have any lab test that is abnormal or we need to change your treatment, we will call you to review the results.   Testing/Procedures: Your physician has recommended that you wear a 14 day monitor. These monitors are medical devices that record the heart's electrical activity. Doctors most often use these monitors to diagnose arrhythmias. Arrhythmias are problems with the speed or rhythm of the heartbeat. The monitor is a small, portable device. You can wear one while you do your normal daily activities. This is usually used to diagnose what is causing palpitations/syncope (passing out).  Follow-Up: At [facility], you and your health needs are our priority.  As part of our continuing mission to provide you with exceptional heart care, we have created designated Provider Care Teams.  These Care Teams include your primary Cardiologist (physician) and Advanced Practice Providers (APPs -  Physician Assistants and Nurse Practitioners) who all work together to provide you with the care you need, when you need it.  We recommend signing up for the patient portal called "MyChart".  Sign up information is provided on this After Visit Summary.  MyChart is used to connect with patients for Virtual Visits (Telemedicine).  Patients are able to view lab/test results, encounter notes, upcoming appointments, etc.  Non-urgent messages can be sent to your provider as well.   To learn more about what you can do with  MyChart, go to [email or web address].    Your next appointment:   3 month(s)  The format for your next appointment:   In Person  Provider:   Rudean Haskell, MD   Other Instructions Bryn Gulling- Long Term Monitor Instructions   Your physician has requested you wear your ZIO patch monitor 14 days.   This is a single patch monitor.  [employer] supplies one patch monitor per enrollment.  Additional stickers are not available.   Please do not apply patch if you will be having a Nuclear Stress Test, Echocardiogram, Cardiac CT, MRI, or Chest Xray during the time frame you would be wearing the monitor. The patch cannot be worn during these tests.  You cannot remove and re-apply the ZIO XT patch monitor.   Your ZIO patch monitor will be sent USPS Priority mail from [facility] directly to your home address. The monitor may also be mailed to a PO BOX if home delivery is not available.   It may take 3-5 days to receive your monitor after you have been enrolled.   Once you have received you monitor, please review enclosed instructions.  Your monitor has already been registered assigning a specific monitor serial # to you.   Applying the monitor   Shave hair from upper left chest.   Hold abrader disc by orange tab.  Rub abrader in 40 strokes over left upper chest as indicated in your monitor instructions.   Clean area with 4 enclosed alcohol pads .  Use all pads to assure are is cleaned thoroughly.  Let dry.   Apply patch  as indicated in monitor instructions.  Patch will be place under collarbone on left side of chest with arrow pointing upward.   Rub patch adhesive wings for 2 minutes.Remove white label marked "1".  Remove white label marked "2".  Rub patch adhesive wings for 2 additional minutes.   While looking in a mirror, press and release button in center of patch.  A small green light will flash 3-4 times .  This will be your only indicator the monitor has been turned on.      Do not shower for the first 24 hours.  You may shower after the first 24 hours.   Press button if you feel a symptom. You will hear a small click.  Record Date, Time and Symptom in the Patient Log Book.   When you are ready to remove patch, follow instructions on last 2 pages of Patient Log Book.  Stick patch monitor onto last page of Patient Log Book.   Place Patient Log Book in Stem box.  Use locking tab on box and tape box closed securely.  The [employer] and [employer] has [employer] on it.  Please place in mailbox as soon as possible.  Your physician should have your test results approximately 7 days after the monitor has been mailed back to [facility].   Call [place] at [phone] if you have questions regarding your ZIO XT patch monitor.  Call them immediately if you see an orange light blinking on your monitor.   If your monitor falls off in less than 4 days contact our Monitor department at [phone].  If your monitor becomes loose or falls off after 4 days call [employer] at [phone] for suggestions on securing your monitor.

## 2020-01-19 NOTE — Progress Notes (Signed)
Cardiology Office Note:    Date:  01/19/2020   ID:  Sally Mitchell, DOB October 02, 1970, MRN [id number]  PCP:  Sally Bang, DO  [place] Cardiologist:  No primary care provider on file.  [facility] Electrophysiologist:  None   CC: Palpitations Consulted for the evaluation of palpitations at the behest of Sally Bang, DO  History of Present Illness:    Sally Mitchell is a 49 y.o. female with a hx of HLD, Hypothyroidism, IDA, who presents for palpitations.  Patient notes that some years ago, after a lot of caffeine and chocolate, palpitations (when her heart racing).  This was 4 years ago.  Had an echocardiogram and stress test without problems.  For years, with avoiding decongestants and caffeine, this resolved.     Patient notes that a few times last week, her heart was racing.  Comes on and off spontaneously.  Nothing makes it better or worse.  No change with anxiety.  No syncope.  No chest pain.  No caffeine or chocolate.  When she was little after standing for long periods of time passed out.  No cardiac syncope.  Past Medical History:  Diagnosis Date  . Anemia   . Eczema    seborrhecic dermatitis  . FH: breast cancer   . Fibroids, submucosal   . GERD (gastroesophageal reflux disease)   . H/O dysmenorrhea   . H/O fatigue   . H/O hematuria   . H/O menorrhagia   . Hair loss   . Hiatal hernia   . History of kidney stones   . Hypercholesterolemia    mild  . Hypopotassemia   . Hypothyroidism   . Insomnia   . Iron deficiency anemia   . Irregular menses     Past Surgical History:  Procedure Laterality Date  . CESAREAN SECTION    . DILATION AND CURETTAGE OF UTERUS    . HYSTEROSCOPY  03/31/2004   with resection of endometrial fibroid  . WISDOM TOOTH EXTRACTION      Current Medications: Current Meds  Medication Sig  . Cholecalciferol (VITAMIN D3 PO) Take 2,000 Units by mouth daily.  . ferrous sulfate 325 (65 FE) MG tablet Take 325 mg  by mouth daily with breakfast.  . Fluocinolone Acetonide 0.01 % OIL Place in ear(s).  . Fluocinolone Acetonide 0.01 % OIL Place in ear(s).  . fluticasone (FLONASE) 50 MCG/ACT nasal spray Place 1-2 sprays into both nostrils as needed for allergies.   Marland Kitchen levothyroxine (SYNTHROID) 112 MCG tablet TAKE 1 TABLET BY MOUTH ONCE DAILY BEFORE BREAKFAST     Allergies:   Doxycycline, E-mycin [erythromycin base], Septra [bactrim], Sulfa antibiotics, and Sulfamethoxazole-trimethoprim   Social History   Socioeconomic History  . Marital status: Married    Spouse name: Not on file  . Number of children: 1  . Years of education: Not on file  . Highest education level: Not on file  Occupational History  . Occupation: cma  Tobacco Use  . Smoking status: Never Smoker  . Smokeless tobacco: Never Used  Vaping Use  . Vaping Use: Never used  Substance and Sexual Activity  . Alcohol use: No  . Drug use: No  . Sexual activity: Not on file  Other Topics Concern  . Not on file  Social History Narrative   Daily caffeine    Social Determinants of Health   Financial Resource Strain:   . Difficulty of Paying Living Expenses: Not on file  Food Insecurity:   . Worried  About Running Out of Food in the Last Year: Not on file  . Ran Out of Food in the Last Year: Not on file  Transportation Needs:   . Lack of Transportation (Medical): Not on file  . Lack of Transportation (Non-Medical): Not on file  Physical Activity:   . Days of Exercise per Week: Not on file  . Minutes of Exercise per Session: Not on file  Stress:   . Feeling of Stress : Not on file  Social Connections:   . Frequency of Communication with Friends and Family: Not on file  . Frequency of Social Gatherings with Friends and Family: Not on file  . Attends Religious Services: Not on file  . Active Member of Clubs or Organizations: Not on file  . Attends Archivist Meetings: Not on file  . Marital Status: Not on file     Family  History: The patient's family history includes Breast cancer in her maternal aunt; Colon polyps in her mother; Heart disease in her father; Hiatal hernia in her mother; Ovarian cancer in her sister; Stroke in her mother; Ulcers in her father. There is no history of Colon cancer. Father had MI in 73s No sudden cardiac death, drowings, and car accidents.  ROS:   Please see the history of present illness.    All other systems reviewed and are negative.  EKGs/Labs/Other Studies Reviewed:    The following studies were reviewed today:  EKG:  12/26/19- Sinus 60 LVH with anterior infarct pattern (borderline q wave width)  Recent Labs: 07/02/2019: ALT 8 12/26/2019: BUN 12; Creatinine, Ser 0.99; Hemoglobin 13.2; Platelets 217; Potassium 4.0; Sodium 138; TSH 1.130  Recent Lipid Panel    Component Value Date/Time   CHOL 213 (H) 07/02/2019 1126   TRIG 57 07/02/2019 1126   HDL 62 07/02/2019 1126   CHOLHDL 3.4 07/02/2019 1126   LDLCALC 141 (H) 07/02/2019 1126    Risk Assessment/Calculations:   ASCVD Risk 0.6%  Physical Exam:    VS:  BP 110/60   Pulse 74   Ht 5\' 4"  (1.626 m)   Wt 158 lb (71.7 kg)   SpO2 98%   BMI 27.12 kg/m     Wt Readings from Last 3 Encounters:  01/19/20 158 lb (71.7 kg)  07/22/18 158 lb (71.7 kg)  10/27/14 152 lb (68.9 kg)    GEN: Well nourished, well developed in no acute distress HEENT: Normal NECK: No JVD; No carotid bruits LYMPHATICS: No lymphadenopathy CARDIAC: RRR, no murmurs, rubs, gallops RESPIRATORY:  Clear to auscultation without rales, wheezing or rhonchi  ABDOMEN: Soft, non-tender, non-distended MUSCULOSKELETAL:  No edema; No deformity  SKIN: Warm and dry NEUROLOGIC:  Alert and oriented x 3 PSYCHIATRIC:  Normal affect   ASSESSMENT:    No diagnosis found. PLAN:    In order of problems listed above:  Palpitations - 14 day non-live ziopatch  Hyperlipidemia - ASCVD risk of 0.6% - gave education on dietary changes  3-4 month  follow up  unless new symptoms or abnormal test results warranting change in plan  Would be reasonable for  Virtual Follow up  Would be reasonable for  APP Follow up   Shared Decision Making/Informed Consent        Medication Adjustments/Labs and Tests Ordered: Current medicines are reviewed at length with the patient today.  Concerns regarding medicines are outlined above.  No orders of the defined types were placed in this encounter.  No orders of the defined types were placed in  this encounter.   There are no Patient Instructions on file for this visit.   Signed, Werner Lean, MD  01/19/2020 4:24 PM    [place]

## 2020-01-20 ENCOUNTER — Encounter: Payer: Self-pay | Admitting: *Deleted

## 2020-01-20 NOTE — Progress Notes (Signed)
Patient ID: Sally Mitchell, female   DOB: Jun 25, 1970, 49 y.o.   MRN: [id number] Patient enrolled for [employer] to ship a 14 day ZIO XT long term holter monitor to her home.

## 2020-01-25 ENCOUNTER — Ambulatory Visit (INDEPENDENT_AMBULATORY_CARE_PROVIDER_SITE_OTHER): Payer: 59

## 2020-01-25 DIAGNOSIS — R002 Palpitations: Secondary | ICD-10-CM | POA: Diagnosis not present

## 2020-02-02 ENCOUNTER — Other Ambulatory Visit (HOSPITAL_COMMUNITY): Payer: Self-pay | Admitting: Dermatology

## 2020-02-02 DIAGNOSIS — L700 Acne vulgaris: Secondary | ICD-10-CM | POA: Diagnosis not present

## 2020-02-02 DIAGNOSIS — L218 Other seborrheic dermatitis: Secondary | ICD-10-CM | POA: Diagnosis not present

## 2020-02-02 DIAGNOSIS — L659 Nonscarring hair loss, unspecified: Secondary | ICD-10-CM | POA: Diagnosis not present

## 2020-02-02 MED FILL — FLUOCINOLONE ACETONIDE SCAL: 0.01 | 90 days supply | Qty: 118 | Fill #0

## 2020-02-02 MED FILL — DAPSONE 5% GEL: 5 | 30 days supply | Qty: 60 | Fill #0

## 2020-02-17 DIAGNOSIS — R002 Palpitations: Secondary | ICD-10-CM | POA: Diagnosis not present

## 2020-02-23 ENCOUNTER — Telehealth: Payer: Self-pay

## 2020-02-23 MED FILL — SYNTHROID 112 MCG TABLET: 112 | 90 days supply | Qty: 90 | Fill #0

## 2020-02-23 NOTE — Telephone Encounter (Signed)
Left a message to call back regarding monitor results.

## 2020-02-23 NOTE — Telephone Encounter (Signed)
-----   Message from Mahesh A Chandrasekhar, MD sent at 02/23/2020 10:39 AM EST ----- Results: Rare, symptomatic PVCs Plan: Discuss pros and cons of treatment at our next visit  Mahesh A Chandrasekhar, MD  

## 2020-02-24 NOTE — Telephone Encounter (Signed)
-----   Message from Christell Constant, MD sent at 02/23/2020 10:39 AM EST ----- Results: Rare, symptomatic PVCs Plan: Discuss pros and cons of treatment at our next visit  Christell Constant, MD

## 2020-02-24 NOTE — Telephone Encounter (Signed)
LVM for return call. 

## 2020-03-23 ENCOUNTER — Encounter: Payer: Self-pay | Admitting: Internal Medicine

## 2020-03-23 ENCOUNTER — Ambulatory Visit: Payer: 59 | Admitting: Internal Medicine

## 2020-03-23 VITALS — BP 106/62 | HR 67 | Ht 64.5 in | Wt 155.0 lb

## 2020-03-23 DIAGNOSIS — Z1211 Encounter for screening for malignant neoplasm of colon: Secondary | ICD-10-CM | POA: Diagnosis not present

## 2020-03-23 DIAGNOSIS — R6881 Early satiety: Secondary | ICD-10-CM | POA: Diagnosis not present

## 2020-03-23 DIAGNOSIS — R1012 Left upper quadrant pain: Secondary | ICD-10-CM

## 2020-03-23 DIAGNOSIS — D259 Leiomyoma of uterus, unspecified: Secondary | ICD-10-CM

## 2020-03-23 NOTE — Progress Notes (Signed)
Sally Mitchell 50 y.o. 01/21/1971 [id number]  Assessment & Plan:   Encounter Diagnoses  Name Primary?  . LUQ pain Yes  . Early satiety   . Colon cancer screening   . Uterine leiomyoma, unspecified location     Evaluate left upper quadrant pain and early satiety with EGD.  Schedule screening colonoscopy.  Question contribution of enlarged uterus with fibroids regarding her symptoms.  I suspect this may be contributing and that hysterectomy would help her in many ways. The risks and benefits as well as alternatives of endoscopic procedure(s) have been discussed and reviewed. All questions answered. The patient agrees to proceed.  UU:VOZDGUY, Bayard Beaver, DO Dr. 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  She describes pain that occurs after defecation it is in the left upper and mid quadrant and often around her menstrual cycle.  She has been losing some weight though only 3 pounds.  Some early satiety the symptoms have been occurring for about 6 months.  She denies any rectal bleeding or change in stool caliber or consistency.  I had seen her back in 2013 for left upper quadrant pain. History of normal EGD 2008 by Dr. Watt Climes. Wt Readings from Last 3 Encounters:  03/23/20 155 lb (70.3 kg)  01/19/20 158 lb (71.7 kg)  07/22/18 158 lb (71.7 kg)    She has a history of uterine fibroids and enlarged uterus due to that, her gynecologist has recommended hysterectomy but she has deferred for now.  She has dysmenorrhea and irregular menstrual bleeding with menorrhagia.  05/09/2016 CT abdomen pelvis with contrast IMPRESSION: 1. No acute abnormality or cause for the patient's abdominal pain today blood. 2. Uterine fibroids  with significant enlargement since the previous examination dated 12/12/2005. The largest is on the right, measuring 9.9 cm in maximum diameter.   Allergies  Allergen Reactions  . Doxycycline Other (See Comments)    Pt get stomach cramps  . E-Mycin [Erythromycin Base]   . Septra [Bactrim]   . Sulfa Antibiotics Other (See Comments)    Pt throat closes up  . Sulfamethoxazole-Trimethoprim    Current Meds  Medication Sig  . Cholecalciferol (VITAMIN D3 PO) Take 2,000 Units by mouth daily.  . ferrous sulfate 325 (65 FE) MG tablet Take 325 mg by mouth 3 (three) times a week.  . Fluocinolone Acetonide Scalp 0.01 % OIL Apply 1 application topically as needed (for scalp).  . fluticasone (FLONASE) 50 MCG/ACT nasal spray Place 1-2 sprays into both nostrils as needed for allergies.   Marland Kitchen levothyroxine (SYNTHROID) 112 MCG tablet TAKE 1 TABLET BY MOUTH ONCE DAILY BEFORE BREAKFAST  . vitamin B-12 (CYANOCOBALAMIN) 1000 MCG tablet Take 1,000 mcg by mouth daily.   Past Medical History:  Diagnosis Date  . Anemia   . Eczema    seborrhecic dermatitis  . FH: breast cancer   . Fibroids, submucosal   . GERD (gastroesophageal reflux disease)   . H/O dysmenorrhea   . H/O fatigue   . H/O hematuria   . H/O menorrhagia   . Hair loss   . Hiatal hernia   . History of kidney stones   . Hypercholesterolemia    mild  . Hypopotassemia   . Hypothyroidism   . Insomnia   .  Iron deficiency anemia   . Irregular menses    Past Surgical History:  Procedure Laterality Date  . CESAREAN SECTION    . DILATION AND CURETTAGE OF UTERUS    . ESOPHAGOGASTRODUODENOSCOPY  2008  . HYSTEROSCOPY  03/31/2004   with resection of endometrial fibroid  . 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   Review of Systems As per HPI eczema sinus problems also all other review of systems negative  Objective:   Physical Exam BP 106/62   Pulse 67   Ht 5' 4.5" (1.638 m)   Wt 155 lb (70.3 kg)   BMI 26.19 kg/m  WDWN NAD Anicteric Lungs cta Cor NL s1s2 no rmg abd - tender LUQ no mass, ribs NT, neg Carnett's - 20 week sized firm uterus - fibroids Rectal - deferred No c/ce Alert and oriented x 3 approp mood/affect

## 2020-03-23 NOTE — Patient Instructions (Signed)
You have been scheduled for an endoscopy and colonoscopy. Please follow the written instructions given to you at your visit today. Please pick up your prep supplies at the pharmacy within the next 1-3 days. If you use inhalers (even only as needed), please bring them with you on the day of your procedure.   Due to recent changes in healthcare laws, you may see the results of your imaging and laboratory studies on MyChart before your provider has had a chance to review them.  We understand that in some cases there may be results that are confusing or concerning to you. Not all laboratory results come back in the same time frame and the provider may be waiting for multiple results in order to interpret others.  Please give us 48 hours in order for your provider to thoroughly review all the results before contacting the office for clarification of your results.    I appreciate the opportunity to care for you. Carl Gessner, MD, FACG 

## 2020-03-24 ENCOUNTER — Encounter: Payer: Self-pay | Admitting: Internal Medicine

## 2020-04-19 ENCOUNTER — Encounter: Payer: Self-pay | Admitting: Internal Medicine

## 2020-04-19 ENCOUNTER — Other Ambulatory Visit: Payer: Self-pay

## 2020-04-19 ENCOUNTER — Ambulatory Visit: Payer: 59 | Admitting: Internal Medicine

## 2020-04-19 VITALS — BP 98/68 | HR 67 | Ht 64.5 in | Wt 160.0 lb

## 2020-04-19 DIAGNOSIS — I493 Ventricular premature depolarization: Secondary | ICD-10-CM

## 2020-04-19 DIAGNOSIS — E785 Hyperlipidemia, unspecified: Secondary | ICD-10-CM

## 2020-04-19 NOTE — Progress Notes (Signed)
Cardiology Office Note:    Date:  04/19/2020   ID:  Sally Mitchell, DOB 31-Aug-1970, MRN [id number]  PCP:  Nicolette Bang, DO  [facility] Cardiologist:  Rudean Haskell MD [place] Electrophysiologist:  None   CC: Palpitations  History of Present Illness:    Sally Mitchell is a 50 y.o. female with a hx of HLD, Hypothyroidism, IDA, who presents  for palpitations 01/19/20.  In interim of this visit, patient had rare PVCs seen on ZioPatch.  Patient notes that she is doing good.  Since last visit notes no changes.  Relevant interval testing or therapy include the heart monitor.  There are no interval hospital/ED visit.    No chest pain or pressure .  No SOB/DOE and no PND/Orthopnea.  No weight gain or leg swelling.  Notes only one episodes of funny heart beats in January while eating lung.  Stopped on its own.  Felt two episodes on the monitor.   Past Medical History:  Diagnosis Date  . Anemia   . Eczema    seborrhecic dermatitis  . FH: breast cancer   . Fibroids, submucosal   . GERD (gastroesophageal reflux disease)   . H/O dysmenorrhea   . H/O fatigue   . H/O hematuria   . H/O menorrhagia   . Hair loss   . Hiatal hernia   . History of kidney stones   . Hypercholesterolemia    mild  . Hypopotassemia   . Hypothyroidism   . Insomnia   . Iron deficiency anemia   . Irregular menses     Past Surgical History:  Procedure Laterality Date  . CESAREAN SECTION    . DILATION AND CURETTAGE OF UTERUS    . ESOPHAGOGASTRODUODENOSCOPY  2008  . HYSTEROSCOPY  03/31/2004   with resection of endometrial fibroid  . WISDOM TOOTH EXTRACTION      Current Medications: Current Meds  Medication Sig  . Cholecalciferol (VITAMIN D3 PO) Take 2,000 Units by mouth daily.  . Dapsone 5 % topical gel Apply 1 g topically as needed.  . ferrous sulfate 325 (65 FE) MG tablet Take 325 mg by mouth 3 (three) times a week.  . Fluocinolone Acetonide Scalp 0.01 % OIL Apply 1  application topically as needed (for scalp).  . fluticasone (FLONASE) 50 MCG/ACT nasal spray Place 1-2 sprays into both nostrils as needed for allergies.   Marland Kitchen levothyroxine (SYNTHROID) 112 MCG tablet TAKE 1 TABLET BY MOUTH ONCE DAILY BEFORE BREAKFAST  . vitamin B-12 (CYANOCOBALAMIN) 1000 MCG tablet Take 1,000 mcg by mouth daily.     Allergies:   Doxycycline, E-mycin [erythromycin base], Septra [bactrim], Sulfa antibiotics, and Sulfamethoxazole-trimethoprim   Social History   Socioeconomic History  . Marital status: Married    Spouse name: Not on file  . Number of children: 1  . Years of education: Not on file  . Highest education level: Not on file  Occupational History  . Occupation: cma  Tobacco Use  . Smoking status: Never Smoker  . Smokeless tobacco: Never Used  Vaping Use  . Vaping Use: Never used  Substance and Sexual Activity  . Alcohol use: No  . Drug use: No  . Sexual activity: Not on file  Other Topics Concern  . 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 There is no history of Colon cancer. Father had MI in 73s No sudden cardiac death, drowings, and car accidents.  ROS:   Please see the history of present illness.    All other systems reviewed and are negative.  EKGs/Labs/Other Studies Reviewed:    The  following studies were reviewed today:  EKG:  12/26/19- Sinus 60 LVH with anterior infarct pattern (borderline q wave width)  Cardiac Event Monitoring: Date: 02/17/2020 Results:  Patient had a minimum heart rate of 52 bpm, maximum heart rate of 123 bpm, and average heart rate of 72 bpm.  Predominant underlying rhythm was sinus rhythm.  Isolated PACs were rare (<1.0%), with rare couplets or triplets present.  Isolated PVCs were rare (<1.0%), with rare couplets or triplets present.  No evidence of complete heart block .  Triggered and diary events associated with sinus rhythm and Idioventricular Rhythm was present with PVCs.   Symptomatic PVCs, No malignant arrhythmias.  Recent Labs: 07/02/2019: ALT 8 12/26/2019: BUN 12; Creatinine, Ser 0.99; Hemoglobin 13.2; Platelets 217; Potassium 4.0; Sodium 138; TSH 1.130  Recent Lipid Panel    Component Value Date/Time   CHOL 213 (H) 07/02/2019 1126   TRIG 57 07/02/2019 1126   HDL 62 07/02/2019 1126   CHOLHDL 3.4 07/02/2019 1126   LDLCALC 141 (H) 07/02/2019 1126    Risk Assessment/Calculations:   ASCVD Risk 0.6%  Physical Exam:    VS:  BP 98/68   Pulse 67   Ht 5' 4.5" (1.638 m)   Wt 160 lb (72.6 kg)   SpO2 98%   BMI 27.04 kg/m     Wt Readings from Last 3 Encounters:  04/19/20 160 lb (72.6 kg)  03/23/20 155 lb (70.3 kg)  01/19/20 158 lb (71.7 kg)    GEN: Well nourished, well developed in no acute distress HEENT: Normal NECK: No JVD; No carotid bruits LYMPHATICS: No lymphadenopathy CARDIAC: RRR, no murmurs, rubs, gallops RESPIRATORY:  Clear to auscultation without rales, wheezing or rhonchi  ABDOMEN: Soft, non-tender, non-distended MUSCULOSKELETAL:  No edema; No deformity  SKIN: Warm and dry NEUROLOGIC:  Alert and oriented x 3 PSYCHIATRIC:  Normal affect   ASSESSMENT:    1. PVC's (premature ventricular contractions)   2. Hyperlipidemia, unspecified hyperlipidemia type    PLAN:    In order of problems listed  above:  PVCs - rare - will defer medications at this time given the potential of medication to increase her fatigue  Hyperlipidemia Family history of CAD (father) - ASCVD risk of 0.6% - gave education on dietary changes - will do CAC and repeat lipids in one year  One year  follow up unless new symptoms or abnormal test results warranting change in plan   Medication Adjustments/Labs and Tests Ordered: Current medicines are reviewed at length with the patient today.  Concerns regarding medicines are outlined above.  No orders of the defined types were placed in this encounter.  No orders of the defined types were placed in this encounter.  Patient Instructions  Medication Instructions:  Your physician recommends that you continue on your current medications as directed. Please refer to the Current Medication list given to you today.  *If you need a refill on your cardiac medications before your next appointment, please call your pharmacy*   Lab Work: NONE If you have labs (blood work) drawn today and your tests are completely normal, you will receive your results only by: Marland Kitchen MyChart Message (if you have MyChart) OR . A paper copy in the mail If you have any lab test that is abnormal or we need to change your treatment, we will call you to review the results.   Testing/Procedures: NONE   Follow-Up: At [facility], you and your health needs are our priority.  As part of our continuing mission to provide you with exceptional heart care, we have created designated Provider Care Teams.  These Care Teams include your primary Cardiologist (physician) and Advanced Practice Providers (APPs -  Physician Assistants and Nurse Practitioners) who all work together to provide you with the care you need, when you need it.  We recommend signing up for the patient portal called "MyChart".  Sign up information is provided on this After Visit Summary.  MyChart is used to connect with  patients for Virtual Visits (Telemedicine).  Patients are able to view lab/test results, encounter notes, upcoming appointments, etc.  Non-urgent messages can be sent to your provider as well.   To learn more about what you can do with MyChart, go to [email or web address].    Your next appointment:   1 year(s)  The format for your next appointment:   In Person  Provider:   You may see Gasper Sells, MD or one of the following Advanced Practice Providers on your designated Care Team:    Melina Copa, PA-C  Ermalinda Barrios, PA-C          Signed, Werner Lean, MD  04/19/2020 4:03 PM    [place]

## 2020-04-19 NOTE — Patient Instructions (Signed)
Medication Instructions:  Your physician recommends that you continue on your current medications as directed. Please refer to the Current Medication list given to you today.  *If you need a refill on your cardiac medications before your next appointment, please call your pharmacy*   Lab Work: NONE If you have labs (blood work) drawn today and your tests are completely normal, you will receive your results only by: Marland Kitchen MyChart Message (if you have MyChart) OR . A paper copy in the mail If you have any lab test that is abnormal or we need to change your treatment, we will call you to review the results.   Testing/Procedures: NONE   Follow-Up: At [facility], you and your health needs are our priority.  As part of our continuing mission to provide you with exceptional heart care, we have created designated Provider Care Teams.  These Care Teams include your primary Cardiologist (physician) and Advanced Practice Providers (APPs -  Physician Assistants and Nurse Practitioners) who all work together to provide you with the care you need, when you need it.  We recommend signing up for the patient portal called "MyChart".  Sign up information is provided on this After Visit Summary.  MyChart is used to connect with patients for Virtual Visits (Telemedicine).  Patients are able to view lab/test results, encounter notes, upcoming appointments, etc.  Non-urgent messages can be sent to your provider as well.   To learn more about what you can do with MyChart, go to [email or web address].    Your next appointment:   1 year(s)  The format for your next appointment:   In Person  Provider:   You may see Gasper Sells, MD or one of the following Advanced Practice Providers on your designated Care Team:    Melina Copa, PA-C  Ermalinda Barrios, PA-C

## 2020-04-21 DIAGNOSIS — L700 Acne vulgaris: Secondary | ICD-10-CM | POA: Diagnosis not present

## 2020-04-21 DIAGNOSIS — L668 Other cicatricial alopecia: Secondary | ICD-10-CM | POA: Diagnosis not present

## 2020-04-21 DIAGNOSIS — L218 Other seborrheic dermatitis: Secondary | ICD-10-CM | POA: Diagnosis not present

## 2020-05-18 MED FILL — FLUOCINOLONE ACETONIDE SCAL: 0.01 | 90 days supply | Qty: 118 | Fill #1

## 2020-05-18 MED FILL — SYNTHROID 112 MCG TABLET: 112 | 90 days supply | Qty: 90 | Fill #1

## 2020-05-21 ENCOUNTER — Ambulatory Visit (AMBULATORY_SURGERY_CENTER): Payer: 59 | Admitting: Internal Medicine

## 2020-05-21 ENCOUNTER — Other Ambulatory Visit: Payer: Self-pay

## 2020-05-21 ENCOUNTER — Encounter: Payer: Self-pay | Admitting: Internal Medicine

## 2020-05-21 VITALS — BP 103/59 | HR 68 | Temp 96.9°F | Resp 20 | Ht 64.0 in | Wt 155.0 lb

## 2020-05-21 DIAGNOSIS — Z1211 Encounter for screening for malignant neoplasm of colon: Secondary | ICD-10-CM | POA: Diagnosis not present

## 2020-05-21 DIAGNOSIS — R1012 Left upper quadrant pain: Secondary | ICD-10-CM | POA: Diagnosis not present

## 2020-05-21 DIAGNOSIS — D120 Benign neoplasm of cecum: Secondary | ICD-10-CM

## 2020-05-21 MED ORDER — SODIUM CHLORIDE 0.9 % IV SOLN: 500.0000 mL | Freq: Once | INTRAVENOUS | Status: DC

## 2020-05-21 NOTE — Progress Notes (Signed)
Medical history reviewed with no changes noted. VS assessed by C.W 

## 2020-05-21 NOTE — Op Note (Signed)
[place] Patient Name: Sally Mitchell Procedure Date: 05/21/2020 1:22 PM MRN: [id number] Endoscopist: Gatha Mayer , MD Age: 50 Referring MD:  Date of Birth: [birth year] Gender: Female Account #: [id number] Procedure:                Upper GI endoscopy Indications:              Abdominal pain in the left upper quadrant Medicines:                Propofol per Anesthesia, Monitored Anesthesia Care Procedure:                Pre-Anesthesia Assessment:                           - Prior to the procedure, a History and Physical                            was performed, and patient medications and                            allergies were reviewed. The patient's tolerance of                            previous anesthesia was also reviewed. The risks                            and benefits of the procedure and the sedation                            options and risks were discussed with the patient.                            All questions were answered, and informed consent                            was obtained. Prior Anticoagulants: The patient has                            taken no previous anticoagulant or antiplatelet                            agents. ASA Grade Assessment: II - A patient with                            mild systemic disease. After reviewing the risks                            and benefits, the patient was deemed in                            satisfactory condition to undergo the procedure.                           After obtaining informed consent, the endoscope was  passed under direct vision. Throughout the                            procedure, the patient's blood pressure, pulse, and                            oxygen saturations were monitored continuously. The                            Endoscope was introduced through the mouth, and                            advanced to the second part of duodenum. The upper                             GI endoscopy was accomplished without difficulty.                            The patient tolerated the procedure well. Scope In: Scope Out: Findings:                 The esophagus was normal.                           The stomach was normal.                           The examined duodenum was normal.                           The cardia and gastric fundus were normal on                            retroflexion.                           Biopsies were taken with a cold forceps on the                            anterior wall of the gastric body and on the lesser                            curvature of the gastric antrum for Helicobacter                            pylori testing using CLOtest. Verification of                            patient identification for the specimen was done.                            Estimated blood loss was minimal. Complications:            No immediate complications. Estimated Blood Loss:     Estimated blood loss was minimal. Impression:               -  Normal esophagus.                           - Normal stomach.                           - Normal examined duodenum.                           - Biopsies were taken with a cold forceps for                            Helicobacter pylori testing using CLOtest. Recommendation:           - Patient has a contact number available for                            emergencies. The signs and symptoms of potential                            delayed complications were discussed with the                            patient. Return to normal activities tomorrow.                            Written discharge instructions were provided to the                            patient.                           - Resume previous diet.                           - Continue present medications.                           - See the other procedure note for documentation of                            additional recommendations. Gatha Mayer, MD 05/21/2020 1:50:56 PM This report has been signed electronically.

## 2020-05-21 NOTE — Op Note (Signed)
[place] Patient Name: Sally Mitchell Procedure Date: 05/21/2020 1:22 PM MRN: [id number] Endoscopist: Gatha Mayer , MD Age: 50 Referring MD:  Date of Birth: [birth year] Gender: Female Account #: [id number] Procedure:                Colonoscopy Indications:              Screening for colorectal malignant neoplasm, This                            is the patient's first colonoscopy Medicines:                Propofol per Anesthesia, Monitored Anesthesia Care Procedure:                Pre-Anesthesia Assessment:                           - Prior to the procedure, a History and Physical                            was performed, and patient medications and                            allergies were reviewed. The patient's tolerance of                            previous anesthesia was also reviewed. The risks                            and benefits of the procedure and the sedation                            options and risks were discussed with the patient.                            All questions were answered, and informed consent                            was obtained. Prior Anticoagulants: The patient has                            taken no previous anticoagulant or antiplatelet                            agents. ASA Grade Assessment: II - A patient with                            mild systemic disease. After reviewing the risks                            and benefits, the patient was deemed in                            satisfactory condition to undergo the procedure.  After obtaining informed consent, the colonoscope                            was passed under direct vision. Throughout the                            procedure, the patient's blood pressure, pulse, and                            oxygen saturations were monitored continuously. The                            Olympus PCF-H190DL [id number]) Colonoscope was                             introduced through the anus and advanced to the the                            cecum, identified by appendiceal orifice and                            ileocecal valve. The colonoscopy was performed                            without difficulty. The patient tolerated the                            procedure well. The quality of the bowel                            preparation was excellent. The ileocecal valve,                            appendiceal orifice, and rectum were photographed.                            The bowel preparation used was Miralax via split                            dose instruction. Scope In: 1:30:45 PM Scope Out: 1:45:33 PM Scope Withdrawal Time: 0 hours 9 minutes 31 seconds  Total Procedure Duration: 0 hours 14 minutes 48 seconds  Findings:                 The perianal and digital rectal examinations were                            normal.                           A 2 to 3 mm polyp was found in the cecum. The polyp                            was sessile. The polyp was removed with a cold  snare. Resection and retrieval were complete.                            Verification of patient identification for the                            specimen was done. Estimated blood loss was minimal.                           The exam was otherwise without abnormality on                            direct and retroflexion views. Complications:            No immediate complications. Estimated Blood Loss:     Estimated blood loss was minimal. Impression:               - One 2 to 3 mm polyp in the cecum, removed with a                            cold snare. Resected and retrieved.                           - The examination was otherwise normal on direct                            and retroflexion views. Recommendation:           - Patient has a contact number available for                            emergencies. The signs and symptoms of potential                             delayed complications were discussed with the                            patient. Return to normal activities tomorrow.                            Written discharge instructions were provided to the                            patient.                           - Resume previous diet.                           - Continue present medications.                           - Await pathology results.                           - Repeat colonoscopy is recommended. The  colonoscopy date will be determined after pathology                            results from today's exam become available for                            review. Gatha Mayer, MD 05/21/2020 1:54:12 PM This report has been signed electronically.

## 2020-05-21 NOTE — Progress Notes (Signed)
Pt. Held in recovery area until fully awake and alert.   Hence disparity in D/C time.  Pt. And daughter agreed pt. Was fully alert and ready to travel home.

## 2020-05-21 NOTE — Progress Notes (Signed)
Called to room to assist during endoscopic procedure.  Patient ID and intended procedure confirmed with present staff. Received instructions for my participation in the procedure from the performing physician.  

## 2020-05-21 NOTE — Progress Notes (Signed)
Report given to PACU, vss 

## 2020-05-21 NOTE — Progress Notes (Signed)
1321 Robinul 0.1 mg IV given due large amount of secretions upon assessment.  MD made aware, vss

## 2020-05-21 NOTE — Patient Instructions (Addendum)
I took stomach biopsies to see if you have an H pylori infection. The upper exam did look ok, though.  Colon had one tiny polyp - removed. All else ok.  I appreciate the opportunity to care for you. Gatha Mayer, MD, [facility]  Polyp handout given to patient.  Resume previous diet. Continue present medications. Await pathology results.   Repeat colonoscopy recommended.  Date to be determined after pathology results reviewed.  YOU HAD AN ENDOSCOPIC PROCEDURE TODAY AT [facility]:   Refer to the procedure report that was given to you for any specific questions about what was found during the examination.  If the procedure report does not answer your questions, please call your gastroenterologist to clarify.  If you requested that your care partner not be given the details of your procedure findings, then the procedure report has been included in a sealed envelope for you to review at your convenience later.  YOU SHOULD EXPECT: Some feelings of bloating in the abdomen. Passage of more gas than usual.  Walking can help get rid of the air that was put into your GI tract during the procedure and reduce the bloating. If you had a lower endoscopy (such as a colonoscopy or flexible sigmoidoscopy) you may notice spotting of blood in your stool or on the toilet paper. If you underwent a bowel prep for your procedure, you may not have a normal bowel movement for a few days.  Please Note:  You might notice some irritation and congestion in your nose or some drainage.  This is from the oxygen used during your procedure.  There is no need for concern and it should clear up in a day or so.  SYMPTOMS TO REPORT IMMEDIATELY:   Following lower endoscopy (colonoscopy or flexible sigmoidoscopy):  Excessive amounts of blood in the stool  Significant tenderness or worsening of abdominal pains  Swelling of the abdomen that is new, acute  Fever of 100F or higher   Following upper endoscopy  (EGD)  Vomiting of blood or coffee ground material  New chest pain or pain under the shoulder blades  Painful or persistently difficult swallowing  New shortness of breath  Fever of 100F or higher  Black, tarry-looking stools  For urgent or emergent issues, a gastroenterologist can be reached at any hour by calling [phone]. Do not use MyChart messaging for urgent concerns.    DIET:  We do recommend a small meal at first, but then you may proceed to your regular diet.  Drink plenty of fluids but you should avoid alcoholic beverages for 24 hours.  ACTIVITY:  You should plan to take it easy for the rest of today and you should NOT DRIVE or use heavy machinery until tomorrow (because of the sedation medicines used during the test).    FOLLOW UP: Our staff will call the number listed on your records 48-72 hours following your procedure to check on you and address any questions or concerns that you may have regarding the information given to you following your procedure. If we do not reach you, we will leave a message.  We will attempt to reach you two times.  During this call, we will ask if you have developed any symptoms of COVID 19. If you develop any symptoms (ie: fever, flu-like symptoms, shortness of breath, cough etc.) before then, please call [phone].  If you test positive for Covid 19 in the 2 weeks post procedure, please call and report this information  to [place].    If any biopsies were taken you will be contacted by phone or by letter within the next 1-3 weeks.  Please call us at [phone] if you have not heard about the biopsies in 3 weeks.    SIGNATURES/CONFIDENTIALITY: You and/or your care partner have signed paperwork which will be entered into your electronic medical record.  These signatures attest to the fact that that the information above on your After Visit Summary has been reviewed and is understood.  Full responsibility of the confidentiality of this discharge  information lies with you and/or your care-partner.

## 2020-05-24 LAB — HELICOBACTER PYLORI SCREEN-BIOPSY: UREASE: NEGATIVE

## 2020-05-25 ENCOUNTER — Telehealth: Payer: Self-pay

## 2020-05-25 NOTE — Telephone Encounter (Signed)
  Follow up Call-  Call back number 05/21/2020  Post procedure Call Back phone  # [phone]  Permission to leave phone message Yes  Some recent data might be hidden     Patient questions:  Do you have a fever, pain , or abdominal swelling? No. Pain Score  0 *  Have you tolerated food without any problems? Yes.    Have you been able to return to your normal activities? Yes.    Do you have any questions about your discharge instructions: Diet   No. Medications  No. Follow up visit  No.  Do you have questions or concerns about your Care? No.  Actions: * If pain score is 4 or above: No action needed, pain <4.  1. Have you developed a fever since your procedure? no  2.   Have you had an respiratory symptoms (SOB or cough) since your procedure? no  3.   Have you tested positive for COVID 19 since your procedure no  4.   Have you had any family members/close contacts diagnosed with the COVID 19 since your procedure?  no   If yes to any of these questions please route to Joylene John, RN and Joella Prince, RN

## 2020-05-25 NOTE — Telephone Encounter (Signed)
Second attempt follow up call to pt, no vm.

## 2020-05-27 ENCOUNTER — Other Ambulatory Visit (HOSPITAL_COMMUNITY): Payer: Self-pay

## 2020-06-04 ENCOUNTER — Encounter: Payer: Self-pay | Admitting: Internal Medicine

## 2020-06-04 DIAGNOSIS — Z86010 Personal history of colonic polyps: Secondary | ICD-10-CM | POA: Insufficient documentation

## 2020-06-04 DIAGNOSIS — Z860101 Personal history of adenomatous and serrated colon polyps: Secondary | ICD-10-CM

## 2020-06-04 HISTORY — DX: Personal history of colonic polyps: Z86.010

## 2020-06-04 HISTORY — DX: Personal history of adenomatous and serrated colon polyps: Z86.0101

## 2020-08-18 ENCOUNTER — Other Ambulatory Visit: Payer: Self-pay

## 2020-08-18 ENCOUNTER — Telehealth: Payer: Self-pay | Admitting: Internal Medicine

## 2020-08-18 ENCOUNTER — Other Ambulatory Visit (HOSPITAL_COMMUNITY): Payer: Self-pay

## 2020-08-18 DIAGNOSIS — E039 Hypothyroidism, unspecified: Secondary | ICD-10-CM

## 2020-08-18 MED ORDER — LEVOTHYROXINE SODIUM 112 MCG PO TABS
ORAL_TABLET | Freq: Every day | ORAL | 1 refills | Status: DC
  Filled 2020-08-18: qty 90, 90d supply, fill #0
  Filled 2020-11-16: qty 90, 90d supply, fill #1

## 2020-08-18 NOTE — Telephone Encounter (Signed)
Pt needs medication refill levothyroxine (SYNTHROID) 112 MCG tablet   Pharmacy  [facility], [place] [address]  Phone:  [phone]  Fax:  [phone]  DEA #:  [id number]

## 2020-08-18 NOTE — Telephone Encounter (Signed)
Levothyroxine refilled

## 2020-08-19 ENCOUNTER — Other Ambulatory Visit (HOSPITAL_COMMUNITY): Payer: Self-pay

## 2020-08-21 ENCOUNTER — Other Ambulatory Visit (HOSPITAL_COMMUNITY): Payer: Self-pay

## 2020-09-07 ENCOUNTER — Ambulatory Visit: Payer: 59 | Admitting: Family

## 2020-10-29 ENCOUNTER — Encounter: Payer: Self-pay | Admitting: Internal Medicine

## 2020-10-29 ENCOUNTER — Ambulatory Visit: Payer: 59 | Admitting: Internal Medicine

## 2020-10-29 ENCOUNTER — Other Ambulatory Visit: Payer: Self-pay

## 2020-10-29 ENCOUNTER — Other Ambulatory Visit (HOSPITAL_COMMUNITY): Payer: Self-pay

## 2020-10-29 VITALS — BP 116/70 | HR 76 | Temp 98.8°F | Resp 18 | Ht 64.0 in | Wt 159.4 lb

## 2020-10-29 DIAGNOSIS — B372 Candidiasis of skin and nail: Secondary | ICD-10-CM | POA: Diagnosis not present

## 2020-10-29 DIAGNOSIS — E785 Hyperlipidemia, unspecified: Secondary | ICD-10-CM | POA: Diagnosis not present

## 2020-10-29 DIAGNOSIS — Z0001 Encounter for general adult medical examination with abnormal findings: Secondary | ICD-10-CM | POA: Diagnosis not present

## 2020-10-29 DIAGNOSIS — R21 Rash and other nonspecific skin eruption: Secondary | ICD-10-CM

## 2020-10-29 DIAGNOSIS — R220 Localized swelling, mass and lump, head: Secondary | ICD-10-CM | POA: Insufficient documentation

## 2020-10-29 DIAGNOSIS — E039 Hypothyroidism, unspecified: Secondary | ICD-10-CM | POA: Diagnosis not present

## 2020-10-29 DIAGNOSIS — D219 Benign neoplasm of connective and other soft tissue, unspecified: Secondary | ICD-10-CM

## 2020-10-29 LAB — COMPREHENSIVE METABOLIC PANEL
ALT: 10 U/L (ref 0–35)
AST: 15 U/L (ref 0–37)
Albumin: 4.3 g/dL (ref 3.5–5.2)
Alkaline Phosphatase: 43 U/L (ref 39–117)
BUN: 10 mg/dL (ref 6–23)
CO2: 26 mEq/L (ref 19–32)
Calcium: 9.5 mg/dL (ref 8.4–10.5)
Chloride: 105 mEq/L (ref 96–112)
Creatinine, Ser: 0.69 mg/dL (ref 0.40–1.20)
GFR: 101.04 mL/min (ref >60.00)
Glucose, Bld: 83 mg/dL (ref 70–99)
Potassium: 3.7 mEq/L (ref 3.5–5.1)
Sodium: 140 mEq/L (ref 135–145)
Total Bilirubin: 1.2 mg/dL (ref 0.2–1.2)
Total Protein: 7.6 g/dL (ref 6.0–8.3)

## 2020-10-29 LAB — LIPID PANEL
Cholesterol: 205 mg/dL — ABNORMAL HIGH (ref 0–200)
HDL: 60.8 mg/dL (ref >39.00)
LDL Cholesterol: 130 mg/dL — ABNORMAL HIGH (ref 0–99)
NonHDL: 144.6
Total CHOL/HDL Ratio: 3
Triglycerides: 74 mg/dL (ref 0.0–149.0)
VLDL: 14.8 mg/dL (ref 0.0–40.0)

## 2020-10-29 LAB — CBC
HCT: 38.8 % (ref 36.0–46.0)
Hemoglobin: 13.2 g/dL (ref 12.0–15.0)
MCHC: 33.9 g/dL (ref 30.0–36.0)
MCV: 94.1 fl (ref 78.0–100.0)
Platelets: 178 10*3/uL (ref 150.0–400.0)
RBC: 4.13 Mil/uL (ref 3.87–5.11)
RDW: 13.9 % (ref 11.5–15.5)
WBC: 3.2 10*3/uL — ABNORMAL LOW (ref 4.0–10.5)

## 2020-10-29 LAB — TSH: TSH: 2.83 u[IU]/mL (ref 0.35–5.50)

## 2020-10-29 LAB — T4, FREE: Free T4: 1.19 ng/dL (ref 0.60–1.60)

## 2020-10-29 LAB — VITAMIN D 25 HYDROXY (VIT D DEFICIENCY, FRACTURES): VITD: 37.8 ng/mL (ref 30.00–100.00)

## 2020-10-29 LAB — VITAMIN B12: Vitamin B-12: 1538 pg/mL — ABNORMAL HIGH (ref 211–911)

## 2020-10-29 LAB — FERRITIN: Ferritin: 22 ng/mL (ref 10.0–291.0)

## 2020-10-29 MED ORDER — MONTELUKAST SODIUM 10 MG PO TABS
10.0000 mg | ORAL_TABLET | Freq: Every day | ORAL | 3 refills | Status: DC
  Filled 2020-10-29: qty 30, 30d supply, fill #0

## 2020-10-29 MED ORDER — NYSTATIN-TRIAMCINOLONE 100000-0.1 UNIT/GM-% EX OINT
1.0000 "application " | TOPICAL_OINTMENT | Freq: Two times a day (BID) | CUTANEOUS | 0 refills | Status: DC
  Filled 2020-10-29: qty 90, 30d supply, fill #0

## 2020-10-29 NOTE — Assessment & Plan Note (Signed)
Checking lipid panel to assess.

## 2020-10-29 NOTE — Progress Notes (Signed)
   Subjective:   Patient ID: Sally Mitchell, female    DOB: 01/29/1971, 50 y.o.   MRN: [id number]  HPI The patient is a 50 YO female coming in new for ongoing medical care and rash/allergies on the face.  Also desires physical today.  PMH, [facility], social history reviewed and updated  Review of Systems  Constitutional: Negative.   HENT: Negative.    Eyes:  Positive for redness and itching.  Respiratory:  Negative for cough, chest tightness and shortness of breath.   Cardiovascular:  Negative for chest pain, palpitations and leg swelling.  Gastrointestinal:  Negative for abdominal distention, abdominal pain, constipation, diarrhea, nausea and vomiting.  Musculoskeletal: Negative.   Skin:  Positive for rash.  Neurological: Negative.   Psychiatric/Behavioral: Negative.     Objective:  Physical Exam Constitutional:      Appearance: She is well-developed.  HENT:     Head: Normocephalic and atraumatic.     Ears:     Comments: Mild upper lip swelling without tongue swelling or throat swelling. There is some wrinkling at the medial aspect of the eyelids without redness in the eyes, there is no crusting or drainage from the eyes. Nose with signs of allergies no crusting. No sinus tenderness Cardiovascular:     Rate and Rhythm: Normal rate and regular rhythm.  Pulmonary:     Effort: Pulmonary effort is normal. No respiratory distress.     Breath sounds: Normal breath sounds. No wheezing or rales.  Abdominal:     General: Bowel sounds are normal. There is no distension.     Palpations: Abdomen is soft.     Tenderness: There is no abdominal tenderness. There is no rebound.  Musculoskeletal:     Cervical back: Normal range of motion.  Skin:    General: Skin is warm and dry.     Comments: Small rash left breast consistent with fungal  Neurological:     Mental Status: She is alert and oriented to person, place, and time.     Coordination: Coordination normal.    Vitals:   10/29/20  0802  BP: 116/70  Pulse: 76  Resp: 18  Temp: 98.8 F (37.1 C)  TempSrc: Oral  SpO2: 99%  Weight: 159 lb 6.4 oz (72.3 kg)  Height: '5\' 4"'$  (1.626 m)    This visit occurred during the SARS-CoV-2 public health emergency.  Safety protocols were in place, including screening questions prior to the visit, additional usage of staff PPE, and extensive cleaning of exam room while observing appropriate contact time as indicated for disinfecting solutions.   Assessment & Plan:

## 2020-10-29 NOTE — Patient Instructions (Addendum)
We will check the labs today.  Think about getting the shingles vaccine and the covid-19 booster.  We have sent in singulair to take in the evening.   We have sent in cream for the breast area. It is okay to use hydrocortisone for the face.

## 2020-10-29 NOTE — Assessment & Plan Note (Signed)
Concern for reaction to facial cream or oral allergy symptom. Rx singulair to help and if no relief can do blood food testing to rule out new food allergy since there is some swelling to the upper lip.

## 2020-10-29 NOTE — Assessment & Plan Note (Signed)
Flu shot yearly. Covid-19 booster recommended. Shingrix declines today but will get soon. Tetanus up to date. Colonoscopy up to date. Mammogram up to date with gyn, pap smear up to date with gyn. Counseled about sun safety and mole surveillance. Counseled about the dangers of distracted driving. Given 10 year screening recommendations.

## 2020-10-29 NOTE — Assessment & Plan Note (Signed)
Per patient are severe and cause pain and bleeding. She is seeing gyn and they have recommended hysterectomy and she is thinking about this.

## 2020-10-29 NOTE — Assessment & Plan Note (Signed)
Checking TSH and free T4 and adjust synthroid 112 mcg daily as needed.  

## 2020-10-29 NOTE — Assessment & Plan Note (Signed)
Rx nystatin cream for left breast rash.

## 2020-10-30 ENCOUNTER — Other Ambulatory Visit (HOSPITAL_COMMUNITY): Payer: Self-pay

## 2020-11-01 ENCOUNTER — Other Ambulatory Visit (HOSPITAL_COMMUNITY): Payer: Self-pay

## 2020-11-16 ENCOUNTER — Other Ambulatory Visit (HOSPITAL_COMMUNITY): Payer: Self-pay

## 2020-11-17 ENCOUNTER — Other Ambulatory Visit (HOSPITAL_COMMUNITY): Payer: Self-pay

## 2020-11-17 MED ORDER — FLUOCINOLONE ACETONIDE SCALP 0.01 % EX OIL
TOPICAL_OIL | CUTANEOUS | 0 refills | Status: DC
  Filled 2020-11-17: qty 118.28, 30d supply, fill #0

## 2020-11-19 ENCOUNTER — Other Ambulatory Visit (HOSPITAL_COMMUNITY): Payer: Self-pay

## 2020-12-17 ENCOUNTER — Other Ambulatory Visit (HOSPITAL_COMMUNITY): Payer: Self-pay

## 2020-12-17 DIAGNOSIS — L700 Acne vulgaris: Secondary | ICD-10-CM | POA: Diagnosis not present

## 2020-12-17 DIAGNOSIS — L218 Other seborrheic dermatitis: Secondary | ICD-10-CM | POA: Diagnosis not present

## 2020-12-17 DIAGNOSIS — H524 Presbyopia: Secondary | ICD-10-CM | POA: Diagnosis not present

## 2020-12-17 MED ORDER — FLUOCINOLONE ACETONIDE SCALP 0.01 % EX OIL
TOPICAL_OIL | CUTANEOUS | 2 refills | Status: DC
  Filled 2020-12-17: qty 118.28, 90d supply, fill #0
  Filled 2021-02-10: qty 118.28, 30d supply, fill #0

## 2020-12-27 ENCOUNTER — Other Ambulatory Visit (HOSPITAL_COMMUNITY): Payer: Self-pay

## 2021-02-10 ENCOUNTER — Other Ambulatory Visit: Payer: Self-pay | Admitting: Internal Medicine

## 2021-02-10 ENCOUNTER — Other Ambulatory Visit: Payer: Self-pay

## 2021-02-10 ENCOUNTER — Other Ambulatory Visit (HOSPITAL_COMMUNITY): Payer: Self-pay

## 2021-02-10 DIAGNOSIS — E039 Hypothyroidism, unspecified: Secondary | ICD-10-CM

## 2021-02-10 MED ORDER — LEVOTHYROXINE SODIUM 112 MCG PO TABS
ORAL_TABLET | Freq: Every day | ORAL | 1 refills | Status: DC
  Filled 2021-02-10: qty 90, 90d supply, fill #0
  Filled 2021-05-12: qty 90, 90d supply, fill #1

## 2021-02-10 MED ORDER — DAPSONE 5 % EX GEL
CUTANEOUS | 2 refills | Status: AC
  Filled 2021-02-10 – 2021-05-05 (×2): qty 90, 30d supply, fill #0

## 2021-02-11 ENCOUNTER — Other Ambulatory Visit (HOSPITAL_COMMUNITY): Payer: Self-pay

## 2021-02-24 ENCOUNTER — Other Ambulatory Visit (HOSPITAL_COMMUNITY): Payer: Self-pay

## 2021-03-30 ENCOUNTER — Telehealth: Payer: Self-pay | Admitting: Internal Medicine

## 2021-03-30 NOTE — Telephone Encounter (Signed)
Can schedule visit if needed. We discussed blood test for food allergens due to new breakout on face which could be done with visit. If symptoms have changed and she desires environmental allergen testing we can refer her to allergy doctor for the skin testing.

## 2021-03-30 NOTE — Telephone Encounter (Signed)
Pt states at last appt she dicussed w/ provider about having an allergen test done, pt inquiring if provider can make the referral or if she needs to schedule an ov  Pt requesting a c/b

## 2021-03-30 NOTE — Telephone Encounter (Signed)
See below

## 2021-04-06 NOTE — Telephone Encounter (Signed)
Called pt. Unable to get in touch with her due to line being busy.

## 2021-04-11 ENCOUNTER — Telehealth: Payer: 59 | Admitting: Family

## 2021-04-11 DIAGNOSIS — H65192 Other acute nonsuppurative otitis media, left ear: Secondary | ICD-10-CM

## 2021-04-11 MED ORDER — AMOXICILLIN-POT CLAVULANATE 875-125 MG PO TABS: 1.0000 | ORAL_TABLET | Freq: Two times a day (BID) | ORAL | 0 refills | Status: DC

## 2021-04-11 NOTE — Progress Notes (Signed)
Virtual Visit Consent   Sally Mitchell, you are scheduled for a virtual visit with a [facility] provider today.     Just as with appointments in the office, your consent must be obtained to participate.  Your consent will be active for this visit and any virtual visit you may have with one of our providers in the next 365 days.     If you have a MyChart account, a copy of this consent can be sent to you electronically.  All virtual visits are billed to your insurance company just like a traditional visit in the office.    As this is a virtual visit, video technology does not allow for your provider to perform a traditional examination.  This may limit your provider's ability to fully assess your condition.  If your provider identifies any concerns that need to be evaluated in person or the need to arrange testing (such as labs, EKG, etc.), we will make arrangements to do so.     Although advances in technology are sophisticated, we cannot ensure that it will always work on either your end or our end.  If the connection with a video visit is poor, the visit may have to be switched to a telephone visit.  With either a video or telephone visit, we are not always able to ensure that we have a secure connection.     I need to obtain your verbal consent now.   Are you willing to proceed with your visit today?    Sally Mitchell has provided verbal consent on 04/11/2021 for a virtual visit (video or telephone).   Evelina Dun, FNP   Date: 04/11/2021 5:55 PM   Virtual Visit via Video Note   I, Evelina Dun, connected with  Sally Mitchell  ([id number], 01/24/1971) on 04/11/21 at  6:00 PM EST by a video-enabled telemedicine application and verified that I am speaking with the correct person using two identifiers.  Location: Patient: Virtual Visit Location Patient: Home Provider: Virtual Visit Location Provider: Home Office   I discussed the limitations of evaluation and management by  telemedicine and the availability of in person appointments. The patient expressed understanding and agreed to proceed.    History of Present Illness: Sally Mitchell is a 51 y.o. who identifies as a female who was assigned female at birth, and is being seen today for ear pain.  HPI: Otalgia  There is pain in the left ear. This is a new problem. The current episode started in the past 7 days. The problem has been waxing and waning. There has been no fever. The pain is at a severity of 7/10. The pain is moderate. Associated symptoms include headaches and rhinorrhea. Pertinent negatives include no coughing, ear discharge, hearing loss or sore throat. She has tried nothing for the symptoms. The treatment provided no relief.   Problems:  Patient Active Problem List   Diagnosis Date Noted   Candidal intertrigo 10/29/2020   Facial swelling 10/29/2020   Encounter for general adult medical examination with abnormal findings 10/29/2020   Hx of adenomatous polyp of colon 06/04/2020   PVC's (premature ventricular contractions) 04/19/2020   Hyperlipidemia 01/19/2020   Palpitations 12/29/2019   Fibroids 06/21/2011   Hypothyroidism 06/21/2011    Allergies:  Allergies  Allergen Reactions   Doxycycline Other (See Comments)    Pt get stomach cramps   E-Mycin [Erythromycin Base]    Septra [Bactrim]    Sulfa Antibiotics Other (See Comments)  Pt throat closes up   Sulfamethoxazole-Trimethoprim    Medications:  Current Outpatient Medications:    amoxicillin-clavulanate (AUGMENTIN) 875-125 MG tablet, Take 1 tablet by mouth 2 (two) times daily., Disp: 14 tablet, Rfl: 0   Cholecalciferol (VITAMIN D3 PO), Take 2,000 Units by mouth daily., Disp: , Rfl:    Dapsone 5 % topical gel, Apply as directed to skin twice a day, Disp: 60 g, Rfl: 2   ferrous sulfate 325 (65 FE) MG tablet, Take 325 mg by mouth 3 (three) times a week., Disp: , Rfl:    Fluocinolone Acetonide Scalp (DERMA-SMOOTHE/FS SCALP) 0.01 % OIL,  Apply 1 mL on the affected area(s) of the skin as directed., Disp: 118.28 mL, Rfl: 2   Fluocinolone Acetonide Scalp 0.01 % OIL, Apply 1 application topically as needed (for scalp)., Disp: , Rfl:    levothyroxine (SYNTHROID) 112 MCG tablet, TAKE 1 TABLET BY MOUTH ONCE DAILY BEFORE BREAKFAST, Disp: 90 tablet, Rfl: 1   nystatin-triamcinolone ointment (MYCOLOG), Apply 1 application topically 2 (two) times daily., Disp: 100 g, Rfl: 0   vitamin B-12 (CYANOCOBALAMIN) 1000 MCG tablet, Take 1,000 mcg by mouth daily., Disp: , Rfl:   Observations/Objective: Patient is well-developed, well-nourished in no acute distress.  Resting comfortably  at home.  Head is normocephalic, atraumatic.  No labored breathing.  Speech is clear and coherent with logical content.  Patient is alert and oriented at baseline.    Assessment and Plan: 1. Other acute nonsuppurative otitis media of left ear, recurrence not specified - amoxicillin-clavulanate (AUGMENTIN) 875-125 MG tablet; Take 1 tablet by mouth 2 (two) times daily.  Dispense: 14 tablet; Refill: 0  - Take meds as prescribed - Use a cool mist humidifier  -Use saline nose sprays frequently -Force fluids -For any cough or congestion  Use plain Mucinex- regular strength or max strength is fine -For fever or aces or pains- take tylenol or ibuprofen. Follow up if symptoms worsen or do not improve    Follow Up Instructions: I discussed the assessment and treatment plan with the patient. The patient was provided an opportunity to ask questions and all were answered. The patient agreed with the plan and demonstrated an understanding of the instructions.  A copy of instructions were sent to the patient via MyChart unless otherwise noted below.     The patient was advised to call back or seek an in-person evaluation if the symptoms worsen or if the condition fails to improve as anticipated.  Time:  I spent 8 minutes with the patient via telehealth technology  discussing the above problems/concerns.    Evelina Dun, FNP

## 2021-05-05 ENCOUNTER — Encounter: Payer: Self-pay | Admitting: Internal Medicine

## 2021-05-05 ENCOUNTER — Ambulatory Visit: Payer: 59 | Admitting: Internal Medicine

## 2021-05-05 ENCOUNTER — Other Ambulatory Visit: Payer: Self-pay

## 2021-05-05 ENCOUNTER — Other Ambulatory Visit (HOSPITAL_COMMUNITY): Payer: Self-pay

## 2021-05-05 VITALS — BP 98/62 | HR 82 | Ht 63.0 in | Wt 164.4 lb

## 2021-05-05 VITALS — BP 124/60 | HR 88 | Resp 18 | Ht 64.0 in | Wt 163.2 lb

## 2021-05-05 DIAGNOSIS — D259 Leiomyoma of uterus, unspecified: Secondary | ICD-10-CM

## 2021-05-05 DIAGNOSIS — N852 Hypertrophy of uterus: Secondary | ICD-10-CM | POA: Diagnosis not present

## 2021-05-05 DIAGNOSIS — R220 Localized swelling, mass and lump, head: Secondary | ICD-10-CM

## 2021-05-05 DIAGNOSIS — R1012 Left upper quadrant pain: Secondary | ICD-10-CM | POA: Diagnosis not present

## 2021-05-05 DIAGNOSIS — R519 Headache, unspecified: Secondary | ICD-10-CM | POA: Diagnosis not present

## 2021-05-05 DIAGNOSIS — J3089 Other allergic rhinitis: Secondary | ICD-10-CM

## 2021-05-05 LAB — SEDIMENTATION RATE: Sed Rate: 23 mm/hr (ref 0–30)

## 2021-05-05 MED ORDER — FLUTICASONE PROPIONATE 50 MCG/ACT NA SUSP
2.0000 | Freq: Every day | NASAL | 6 refills | Status: DC
  Filled 2021-05-05: qty 16, 30d supply, fill #0
  Filled 2021-08-11: qty 16, 30d supply, fill #1
  Filled 2022-04-07: qty 16, 30d supply, fill #2

## 2021-05-05 MED ORDER — PREDNISONE 20 MG PO TABS
40.0000 mg | ORAL_TABLET | Freq: Every day | ORAL | 0 refills | Status: DC
  Filled 2021-05-05: qty 10, 5d supply, fill #0

## 2021-05-05 NOTE — Progress Notes (Signed)
? ?  Subjective:  ? ?Patient ID: Sally Mitchell, female    DOB: 1970-04-01, 51 y.o.   MRN: [id number] ? ?HPI ?The patient is a 51 YO female coming in for concerns.  ? ?Review of Systems  ?Constitutional: Negative.   ?HENT:  Positive for sinus pain.   ?     Lip tingling/swelling  ?Eyes: Negative.   ?Respiratory:  Negative for cough, chest tightness and shortness of breath.   ?Cardiovascular:  Negative for chest pain, palpitations and leg swelling.  ?Gastrointestinal:  Negative for abdominal distention, abdominal pain, constipation, diarrhea, nausea and vomiting.  ?Musculoskeletal: Negative.   ?Skin: Negative.   ?Neurological:  Positive for headaches.  ?Psychiatric/Behavioral: Negative.    ? ?Objective:  ?Physical Exam ?Constitutional:   ?   Appearance: She is well-developed.  ?HENT:  ?   Head: Normocephalic and atraumatic.  ?   Comments: Left temporal region tenderness, no lip or facial swelling. No oropharynx swelling ?   Right Ear: Tympanic membrane normal.  ?   Left Ear: Tympanic membrane normal.  ?Cardiovascular:  ?   Rate and Rhythm: Normal rate and regular rhythm.  ?Pulmonary:  ?   Effort: Pulmonary effort is normal. No respiratory distress.  ?   Breath sounds: Normal breath sounds. No wheezing or rales.  ?Abdominal:  ?   General: Bowel sounds are normal. There is no distension.  ?   Palpations: Abdomen is soft.  ?   Tenderness: There is no abdominal tenderness. There is no rebound.  ?Musculoskeletal:  ?   Cervical back: Normal range of motion.  ?Skin: ?   General: Skin is warm and dry.  ?Neurological:  ?   Mental Status: She is alert and oriented to person, place, and time.  ?   Coordination: Coordination normal.  ? ? ?Vitals:  ? 05/05/21 1029  ?BP: 124/60  ?Pulse: 88  ?Resp: 18  ?SpO2: 99%  ?Weight: 163 lb 3.2 oz (74 kg)  ?Height: '5\' 4"'$  (1.626 m)  ? ? ?This visit occurred during the SARS-CoV-2 public health emergency.  Safety protocols were in place, including screening questions prior to the visit, additional  usage of staff PPE, and extensive cleaning of exam room while observing appropriate contact time as indicated for disinfecting solutions.  ? ?Assessment & Plan:  ? ?

## 2021-05-05 NOTE — Patient Instructions (Signed)
We are checking the labs today. ? ?We have sent in flonase for the sinuses and prednisone to take 2 pills daily for 5 days to help clear the pain. ?

## 2021-05-05 NOTE — Progress Notes (Addendum)
? ?Sally Mitchell 51 y.o. 22-Mar-1970 [id number] ? ?Assessment & Plan:  ? ?Encounter Diagnoses  ?Name Primary?  ? LUQ pain Yes  ? Enlarged uterus   ? Uterine leiomyoma, unspecified location   ? ? ?We talked about whether or not to repeat a CT scan.  I doubt that that would shed any light on these issues.  She was having some nausea and vomiting and diarrhea perhaps she has a partial obstruction at times.  I think a hysterectomy makes sense and would bet that it would alleviate all of her problems.  She will go back to gynecology and discuss. ? ?CC: Hoyt Koch, MD ?Dr. Terri Piedra gynecology ? ?Subjective:  ? ?Chief Complaint: Abdominal pain ? 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  Sometimes they will be left lower quadrant discomfort.  She has a chronically enlarged and fibroid laden uterus.  She has been recommended to have a hysterectomy but has been reluctant to do so.  She sees Dr. Terri Piedra of gynecology.  She has had a few episodes of nausea vomiting and diarrhea that were isolated over the past 6 months.  No clear triggers. ? ?Food allergy panel ordered by PCP today due to history of some mild lip and facial swelling at times thought perhaps due to certain foods question pizza 1 time. ?Wt Readings from Last 3 Encounters:  ?05/05/21 164 lb 6 oz (74.6 kg)  ?05/05/21 163 lb 3.2 oz (74 kg)  ?10/29/20 159 lb 6.4 oz (72.3 kg)  ? ?EGD 05/21/2020 ?- Normal esophagus. ?- Normal stomach. ?- Normal examined duodenum. ?- Biopsies were taken with a cold forceps for Helicobacter pylori testing using CLOtest. ?Colonoscopy 05/21/2020 ?- One 2 to 3 mm polyp in the cecum, removed with a cold snare. Resected and retrieved. ADENOMA ?- The examination was otherwise normal on direct and retroflexion views. ? ? ?CT abdomen pelvis with contrast 2018 uterine fibroids with progressive enlargement of the uterus ?Allergies   ?Allergen Reactions  ? Doxycycline Other (See Comments)  ?  Pt get stomach cramps  ? E-Mycin [Erythromycin Base]   ? Septra [Bactrim]   ? Sulfa Antibiotics Other (See Comments)  ?  Pt throat closes up  ? Sulfamethoxazole-Trimethoprim   ? ?Current Meds  ?Medication Sig  ? cholecalciferol (VITAMIN D) 25 MCG (1000 UNIT) tablet Take 2,000 Units by mouth daily.  ? Cholecalciferol (VITAMIN D3 PO) Take 2,000 Units by mouth daily.  ? Dapsone 5 % topical gel Apply as directed to skin twice a day  ? ferrous sulfate 325 (65 FE) MG tablet Take 325 mg by mouth 3 (three) times a week.  ? Fluocinolone Acetonide Scalp (DERMA-SMOOTHE/FS SCALP) 0.01 % OIL Apply 1 mL on the affected area(s) of the skin as directed.  ? Fluocinolone Acetonide Scalp 0.01 % OIL Apply 1 application topically as needed (for scalp).  ? fluticasone (FLONASE) 50 MCG/ACT nasal spray Place 2 sprays into both nostrils daily.  ? levothyroxine (SYNTHROID) 112 MCG tablet TAKE 1 TABLET BY MOUTH ONCE DAILY BEFORE BREAKFAST  ? nystatin-triamcinolone ointment (MYCOLOG) Apply 1 application topically 2 (two) times daily.  ? predniSONE (DELTASONE) 20 MG tablet Take 2 tablets (40 mg total) by mouth daily with breakfast.  ? vitamin B-12 (CYANOCOBALAMIN) 1000 MCG tablet Take 1,000 mcg by mouth daily.  ? ?Past Medical History:  ?Diagnosis Date  ? Acne vulgaris   ? Anemia   ? Eczema   ?  seborrhecic dermatitis  ? FH: breast cancer   ? Fibroids, submucosal   ? GERD (gastroesophageal reflux disease)   ? H/O dysmenorrhea   ? H/O fatigue   ? H/O hematuria   ? H/O menorrhagia   ? Hair loss   ? Hiatal hernia   ? History of kidney stones   ? Hx of adenomatous polyp of colon 06/04/2020  ? Hypercholesterolemia   ? mild  ? Hypopotassemia   ? Hypothyroidism   ? Insomnia   ? Iron deficiency anemia   ? Irregular menses   ? PVC (premature ventricular contraction)   ? ?Past Surgical History:  ?Procedure Laterality Date  ? CESAREAN SECTION    ? COLONOSCOPY    ? DILATION AND CURETTAGE OF UTERUS     ? ESOPHAGOGASTRODUODENOSCOPY  2008  ? HYSTEROSCOPY  03/31/2004  ? with resection of endometrial fibroid  ? WISDOM TOOTH EXTRACTION    ? ?Social History  ? ?Social History Narrative  ? Widowed, husband died from complications of autoimmune liver disease   ? 1 daughter  ? 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 No alcohol no caffeine no drugs no tobacco  ? ?family history includes Breast cancer in her maternal aunt; Colon polyps in her mother; Heart disease in her father; Hiatal hernia in her mother; Ovarian cancer in her sister; Stroke in her mother; Ulcers in her father. ? ? ?Review of Systems ? ? ?Objective:  ? Physical Exam ?BP 98/62 (BP Location: Left Arm, Patient Position: Sitting, Cuff Size: Normal)   Pulse 82   Ht '5\' 3"'$  (1.6 m)   Wt 164 lb 6 oz (74.6 kg)   LMP 04/12/2021   SpO2 97%   BMI 29.12 kg/m?  ?Middle-aged black woman in no acute distress ?Lungs are clear ?Heart sounds are normal ?The abdomen has a palpable firm uterus that takes up the lower half really comes to the umbilicus like a 70-YOVZ uterus.  Its not necessarily tender but it does seem a little sensitive when I palpate.  Otherwise unremarkable. ? ?

## 2021-05-05 NOTE — Patient Instructions (Signed)
?  If you are age 51 or younger, your body mass index should be between 19-25. Your Body mass index is 29.12 kg/m?Marland Kitchen If this is out of the aformentioned range listed, please consider follow up with your Primary Care Provider.  ? ? ?The [facility] providers would like to encourage you to use [facility] to communicate with providers for non-urgent requests or questions.  Due to long hold times on the telephone, sending your provider a message by [facility] may be a faster and more efficient way to get a response.  Please allow 48 business hours for a response.  Please remember that this is for non-urgent requests.  ?_______________________________________________________ ? ?Per Dr Carlean Purl go back and see your GYN Doctor. ? ? ?I appreciate the opportunity to care for you. ?Silvano Rusk, MD, [facility] ? ? ?

## 2021-05-06 ENCOUNTER — Encounter: Payer: Self-pay | Admitting: Internal Medicine

## 2021-05-06 ENCOUNTER — Other Ambulatory Visit (HOSPITAL_COMMUNITY): Payer: Self-pay

## 2021-05-06 DIAGNOSIS — J309 Allergic rhinitis, unspecified: Secondary | ICD-10-CM | POA: Insufficient documentation

## 2021-05-06 DIAGNOSIS — R519 Headache, unspecified: Secondary | ICD-10-CM | POA: Insufficient documentation

## 2021-05-06 LAB — FOOD ALLERGY PROFILE
Allergen, Salmon, f41: <0.1 kU/L
Almonds: <0.1 kU/L
CLASS: 0
CLASS: 0
CLASS: 0
CLASS: 0
CLASS: 0
CLASS: 0
CLASS: 0
CLASS: 0
CLASS: 0
CLASS: 0
CLASS: 0
Cashew IgE: <0.1 kU/L
Class: 0
Class: 0
Class: 0
Class: 0
Egg White IgE: <0.1 kU/L
Fish Cod: <0.1 kU/L
Hazelnut: <0.1 kU/L
Milk IgE: <0.1 kU/L
Peanut IgE: <0.1 kU/L
Scallop IgE: <0.1 kU/L
Sesame Seed f10: <0.1 kU/L
Shrimp IgE: <0.1 kU/L
Soybean IgE: <0.1 kU/L
Tuna IgE: <0.1 kU/L
Walnut: <0.1 kU/L
Wheat IgE: <0.1 kU/L

## 2021-05-06 LAB — INTERPRETATION:

## 2021-05-06 NOTE — Assessment & Plan Note (Signed)
She is having new headaches with left temporal tenderness. No change in vision. No elevated blood pressure. Need to rule out temporal arteritis. Ordered ESR today. Suspect based on exam component of allergic drainage causing sinus pressure and possible TMJ. Rx prednisone 5 day course for possible TMJ. Also rx for flonase to treat allergic component.  ?

## 2021-05-06 NOTE — Assessment & Plan Note (Signed)
She has had recurrent lip tingling/swelling after eating and is unable to isolate a food. We have ordered blood testing for food allergens today. If no culprit is identified we may need to refer to allergy.  ?

## 2021-05-06 NOTE — Assessment & Plan Note (Signed)
Rx flonase today for likely some component of sinus pressure in her left sinuses.  ?

## 2021-05-12 ENCOUNTER — Other Ambulatory Visit (HOSPITAL_COMMUNITY): Payer: Self-pay

## 2021-06-29 ENCOUNTER — Encounter: Payer: 59 | Admitting: Internal Medicine

## 2021-07-01 ENCOUNTER — Ambulatory Visit (INDEPENDENT_AMBULATORY_CARE_PROVIDER_SITE_OTHER): Payer: 59 | Admitting: Internal Medicine

## 2021-07-01 ENCOUNTER — Encounter: Payer: Self-pay | Admitting: Internal Medicine

## 2021-07-01 VITALS — BP 118/68 | HR 66 | Resp 18 | Ht 63.0 in | Wt 163.8 lb

## 2021-07-01 DIAGNOSIS — D219 Benign neoplasm of connective and other soft tissue, unspecified: Secondary | ICD-10-CM | POA: Diagnosis not present

## 2021-07-01 DIAGNOSIS — E039 Hypothyroidism, unspecified: Secondary | ICD-10-CM

## 2021-07-01 DIAGNOSIS — E785 Hyperlipidemia, unspecified: Secondary | ICD-10-CM

## 2021-07-01 LAB — COMPREHENSIVE METABOLIC PANEL
ALT: 11 U/L (ref 0–35)
AST: 15 U/L (ref 0–37)
Albumin: 4.3 g/dL (ref 3.5–5.2)
Alkaline Phosphatase: 46 U/L (ref 39–117)
BUN: 10 mg/dL (ref 6–23)
CO2: 27 mEq/L (ref 19–32)
Calcium: 9 mg/dL (ref 8.4–10.5)
Chloride: 104 mEq/L (ref 96–112)
Creatinine, Ser: 0.74 mg/dL (ref 0.40–1.20)
GFR: 93.75 mL/min (ref >60.00)
Glucose, Bld: 83 mg/dL (ref 70–99)
Potassium: 3.5 mEq/L (ref 3.5–5.1)
Sodium: 140 mEq/L (ref 135–145)
Total Bilirubin: 1 mg/dL (ref 0.2–1.2)
Total Protein: 7.5 g/dL (ref 6.0–8.3)

## 2021-07-01 LAB — LIPID PANEL
Cholesterol: 205 mg/dL — ABNORMAL HIGH (ref 0–200)
HDL: 58.7 mg/dL (ref >39.00)
LDL Cholesterol: 133 mg/dL — ABNORMAL HIGH (ref 0–99)
NonHDL: 146.66
Total CHOL/HDL Ratio: 3
Triglycerides: 67 mg/dL (ref 0.0–149.0)
VLDL: 13.4 mg/dL (ref 0.0–40.0)

## 2021-07-01 LAB — CBC
HCT: 38.6 % (ref 36.0–46.0)
Hemoglobin: 13.1 g/dL (ref 12.0–15.0)
MCHC: 33.9 g/dL (ref 30.0–36.0)
MCV: 95.7 fl (ref 78.0–100.0)
Platelets: 194 10*3/uL (ref 150.0–400.0)
RBC: 4.04 Mil/uL (ref 3.87–5.11)
RDW: 13.1 % (ref 11.5–15.5)
WBC: 3 10*3/uL — ABNORMAL LOW (ref 4.0–10.5)

## 2021-07-01 LAB — VITAMIN D 25 HYDROXY (VIT D DEFICIENCY, FRACTURES): VITD: 34.81 ng/mL (ref 30.00–100.00)

## 2021-07-01 LAB — FERRITIN: Ferritin: 25.6 ng/mL (ref 10.0–291.0)

## 2021-07-01 LAB — TSH: TSH: 4.03 u[IU]/mL (ref 0.35–5.50)

## 2021-07-01 LAB — VITAMIN B12: Vitamin B-12: >1504 pg/mL — ABNORMAL HIGH (ref 211–911)

## 2021-07-01 NOTE — Progress Notes (Signed)
? ?  Subjective:  ? ?Patient ID: Sally Mitchell, female    DOB: 03/24/1970, 51 y.o.   MRN: [id number] ? ?HPI ?The patient is here for follow up. ? ?PMH, [facility], social history reviewed and updated ? ?Review of Systems  ?Constitutional: Negative.   ?HENT: Negative.    ?Eyes: Negative.   ?Respiratory:  Negative for cough, chest tightness and shortness of breath.   ?Cardiovascular:  Negative for chest pain, palpitations and leg swelling.  ?Gastrointestinal:  Negative for abdominal distention, abdominal pain, constipation, diarrhea, nausea and vomiting.  ?Musculoskeletal: Negative.   ?Skin: Negative.   ?Neurological: Negative.   ?Psychiatric/Behavioral: Negative.    ? ?Objective:  ?Physical Exam ?Constitutional:   ?   Appearance: She is well-developed.  ?HENT:  ?   Head: Normocephalic and atraumatic.  ?Cardiovascular:  ?   Rate and Rhythm: Normal rate and regular rhythm.  ?Pulmonary:  ?   Effort: Pulmonary effort is normal. No respiratory distress.  ?   Breath sounds: Normal breath sounds. No wheezing or rales.  ?Abdominal:  ?   General: Bowel sounds are normal. There is no distension.  ?   Palpations: Abdomen is soft.  ?   Tenderness: There is no abdominal tenderness. There is no rebound.  ?Musculoskeletal:  ?   Cervical back: Normal range of motion.  ?Skin: ?   General: Skin is warm and dry.  ?Neurological:  ?   Mental Status: She is alert and oriented to person, place, and time.  ?   Coordination: Coordination normal.  ? ? ?Vitals:  ? 07/01/21 0800  ?BP: 118/68  ?Pulse: 66  ?Resp: 18  ?SpO2: 100%  ?Weight: 163 lb 12.8 oz (74.3 kg)  ?Height: '5\' 3"'$  (1.6 m)  ? ? ?This visit occurred during the SARS-CoV-2 public health emergency.  Safety protocols were in place, including screening questions prior to the visit, additional usage of staff PPE, and extensive cleaning of exam room while observing appropriate contact time as indicated for disinfecting solutions.  ? ?Assessment & Plan:  ?Shingrix IM given at visit ?

## 2021-07-01 NOTE — Assessment & Plan Note (Signed)
Checking TSH and adjust synthroid 112 mcg daily as needed.  ?

## 2021-07-01 NOTE — Assessment & Plan Note (Signed)
Checking lipid panel, not on meds, adjust as needed.  ?

## 2021-07-01 NOTE — Assessment & Plan Note (Signed)
Concern for more tiredness and some mild SOB may be worsening. Checking CBC and ferritin.  ?

## 2021-07-01 NOTE — Patient Instructions (Signed)
Think about getting the mammogram and the shingles vaccine. ?

## 2021-08-11 ENCOUNTER — Other Ambulatory Visit (HOSPITAL_COMMUNITY): Payer: Self-pay

## 2021-08-11 ENCOUNTER — Other Ambulatory Visit: Payer: Self-pay | Admitting: Internal Medicine

## 2021-08-11 DIAGNOSIS — E039 Hypothyroidism, unspecified: Secondary | ICD-10-CM

## 2021-08-11 MED ORDER — LEVOTHYROXINE SODIUM 112 MCG PO TABS
ORAL_TABLET | Freq: Every day | ORAL | 1 refills | Status: DC
  Filled 2021-08-11: qty 90, 90d supply, fill #0
  Filled 2021-11-16: qty 90, 90d supply, fill #1

## 2021-08-12 ENCOUNTER — Other Ambulatory Visit (HOSPITAL_COMMUNITY): Payer: Self-pay

## 2021-09-26 IMAGING — MG MM DIGITAL DIAGNOSTIC UNILAT*L* W/ TOMO W/ CAD
4 series · 4 of 12 positions shown · non-contrast
Comparison: Previous exam(s).

CLINICAL DATA: Patient presents today recall from screening for a
possible left breast mass.

EXAM:
DIGITAL DIAGNOSTIC LEFT MAMMOGRAM WITH TOMO
ULTRASOUND LEFT BREAST

[L CC synth-2D]
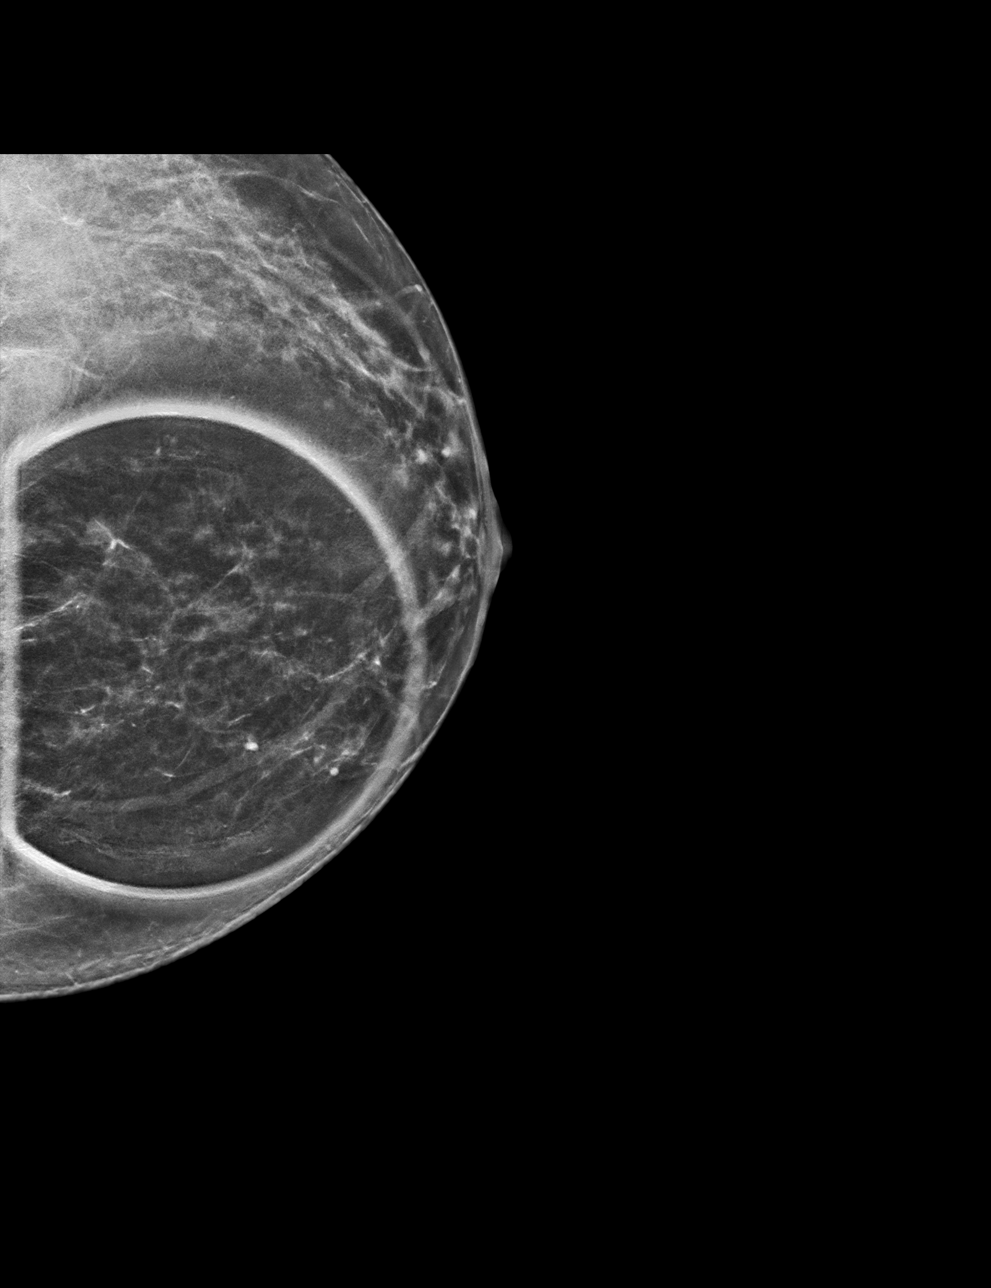

[L MLO synth-2D]
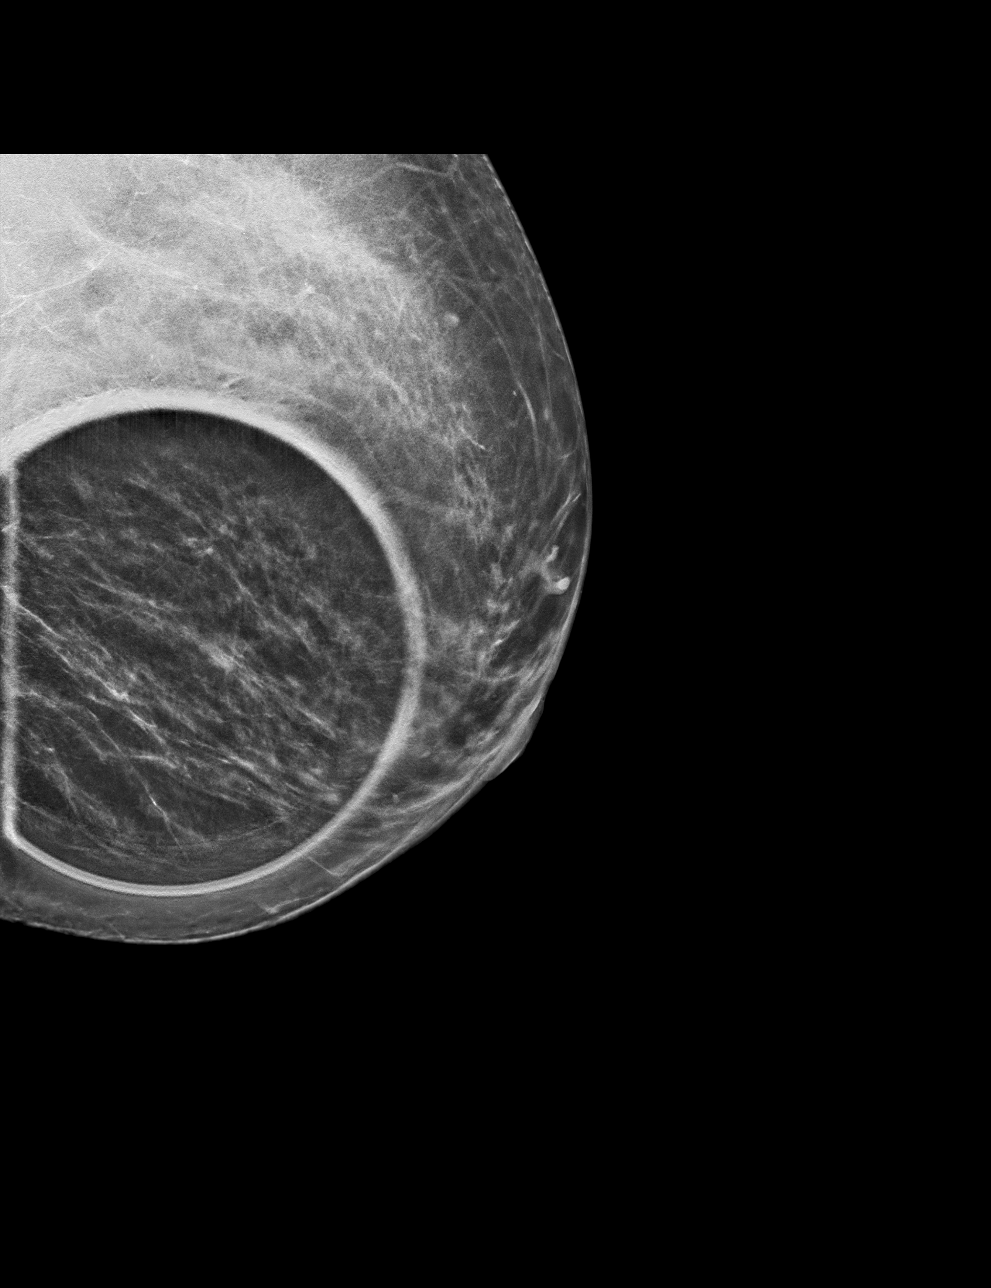

[L MLO tomo · tomo slice 30/59.0]
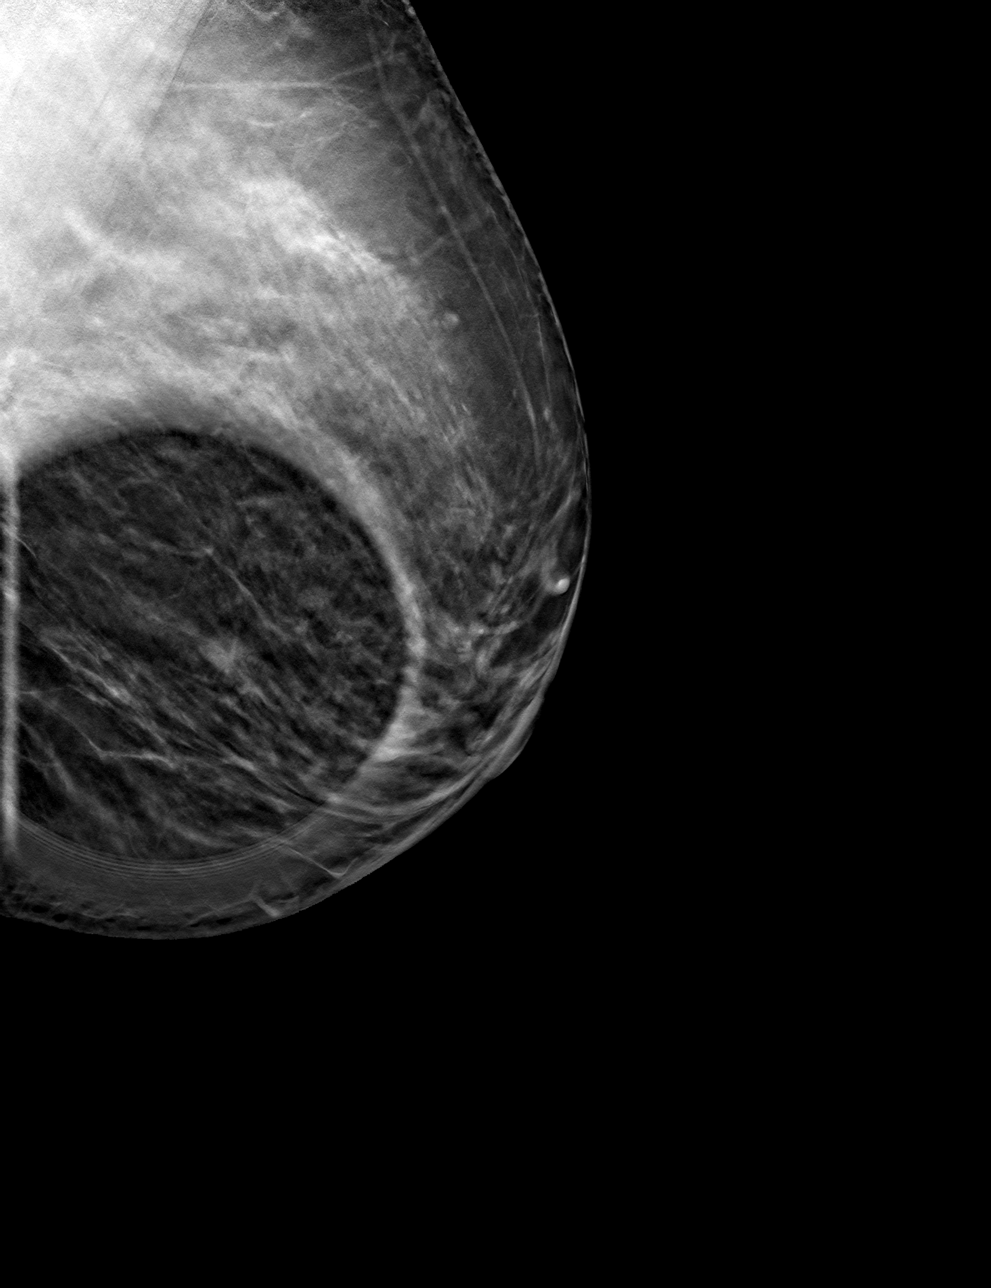

[L CC tomo · tomo slice 29/56.0]
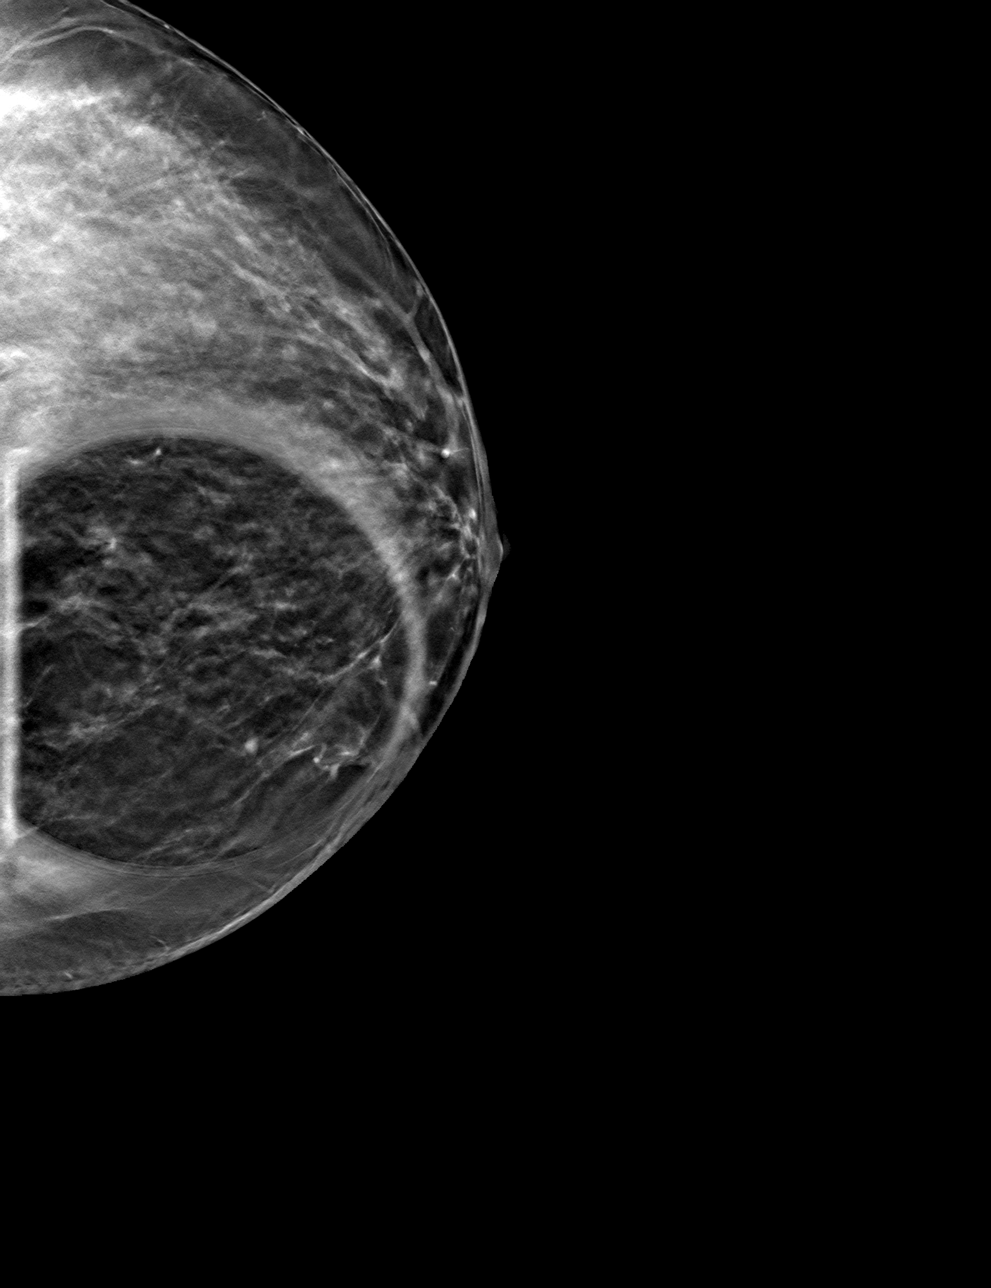

[4 of 12 positions shown; findings below may reference images not displayed]

ACR Breast Density Category c: The breast tissue is heterogeneously
dense, which may obscure small masses.
FINDINGS: Mammogram: Additional spot compression tomosynthesis views were
performed for the questioned mass in the central slightly medial
left breast. On the additional imaging there is persistence of an
oval mass measuring approximately 0.6 cm.

Ultrasound:

Targeted ultrasound is performed in the central/9 o'clock position
of the left breast demonstrating an oval circumscribed anechoic mass
with increased through transmission overall measuring 0.6 x 0.4 x
0.6 cm. No internal blood flow identified. This corresponds to the
mammographic finding.
IMPRESSION: Left breast mass at 9 o'clock measuring 0.6 cm is consistent with a
simple cyst. No mammographic or sonographic evidence of malignancy.

RECOMMENDATION:
Screening mammogram in one year.(Code:M1-4-8Q6)

I have discussed the findings and recommendations with the patient.
If applicable, a reminder letter will be sent to the patient
regarding the next appointment.

BI-RADS CATEGORY  2: Benign.

## 2021-09-28 ENCOUNTER — Other Ambulatory Visit (HOSPITAL_COMMUNITY): Payer: Self-pay

## 2021-09-28 ENCOUNTER — Encounter: Payer: Self-pay | Admitting: Family Medicine

## 2021-09-28 ENCOUNTER — Ambulatory Visit: Payer: 59 | Admitting: Family Medicine

## 2021-09-28 VITALS — BP 120/60 | HR 65 | Temp 97.8°F | Ht 63.0 in | Wt 165.0 lb

## 2021-09-28 DIAGNOSIS — R5383 Other fatigue: Secondary | ICD-10-CM | POA: Diagnosis not present

## 2021-09-28 DIAGNOSIS — N939 Abnormal uterine and vaginal bleeding, unspecified: Secondary | ICD-10-CM

## 2021-09-28 DIAGNOSIS — D649 Anemia, unspecified: Secondary | ICD-10-CM | POA: Insufficient documentation

## 2021-09-28 DIAGNOSIS — R519 Headache, unspecified: Secondary | ICD-10-CM | POA: Diagnosis not present

## 2021-09-28 DIAGNOSIS — H60502 Unspecified acute noninfective otitis externa, left ear: Secondary | ICD-10-CM | POA: Diagnosis not present

## 2021-09-28 DIAGNOSIS — F32A Depression, unspecified: Secondary | ICD-10-CM | POA: Insufficient documentation

## 2021-09-28 DIAGNOSIS — D219 Benign neoplasm of connective and other soft tissue, unspecified: Secondary | ICD-10-CM

## 2021-09-28 DIAGNOSIS — N920 Excessive and frequent menstruation with regular cycle: Secondary | ICD-10-CM | POA: Insufficient documentation

## 2021-09-28 LAB — CBC WITH DIFFERENTIAL/PLATELET
Basophils Absolute: 0 10*3/uL (ref 0.0–0.1)
Basophils Relative: 1.2 % (ref 0.0–3.0)
Eosinophils Absolute: 0.1 10*3/uL (ref 0.0–0.7)
Eosinophils Relative: 2.9 % (ref 0.0–5.0)
HCT: 38.3 % (ref 36.0–46.0)
Hemoglobin: 12.9 g/dL (ref 12.0–15.0)
Lymphocytes Relative: 38.3 % (ref 12.0–46.0)
Lymphs Abs: 1.6 10*3/uL (ref 0.7–4.0)
MCHC: 33.6 g/dL (ref 30.0–36.0)
MCV: 95.8 fl (ref 78.0–100.0)
Monocytes Absolute: 0.3 10*3/uL (ref 0.1–1.0)
Monocytes Relative: 7.5 % (ref 3.0–12.0)
Neutro Abs: 2.1 10*3/uL (ref 1.4–7.7)
Neutrophils Relative %: 50.1 % (ref 43.0–77.0)
Platelets: 233 10*3/uL (ref 150.0–400.0)
RBC: 4 Mil/uL (ref 3.87–5.11)
RDW: 13 % (ref 11.5–15.5)
WBC: 4.1 10*3/uL (ref 4.0–10.5)

## 2021-09-28 LAB — COMPREHENSIVE METABOLIC PANEL
ALT: 10 U/L (ref 0–35)
AST: 17 U/L (ref 0–37)
Albumin: 4.4 g/dL (ref 3.5–5.2)
Alkaline Phosphatase: 48 U/L (ref 39–117)
BUN: 17 mg/dL (ref 6–23)
CO2: 28 mEq/L (ref 19–32)
Calcium: 9.6 mg/dL (ref 8.4–10.5)
Chloride: 104 mEq/L (ref 96–112)
Creatinine, Ser: 0.89 mg/dL (ref 0.40–1.20)
GFR: 75 mL/min (ref >60.00)
Glucose, Bld: 80 mg/dL (ref 70–99)
Potassium: 3.5 mEq/L (ref 3.5–5.1)
Sodium: 137 mEq/L (ref 135–145)
Total Bilirubin: 0.7 mg/dL (ref 0.2–1.2)
Total Protein: 7.6 g/dL (ref 6.0–8.3)

## 2021-09-28 LAB — TSH: TSH: 2.77 u[IU]/mL (ref 0.35–5.50)

## 2021-09-28 MED ORDER — CIPROFLOXACIN-DEXAMETHASONE 0.3-0.1 % OT SUSP
4.0000 [drp] | Freq: Two times a day (BID) | OTIC | 0 refills | Status: DC
  Filled 2021-09-28: qty 7.5, 19d supply, fill #0

## 2021-09-28 MED ORDER — AMOXICILLIN-POT CLAVULANATE 875-125 MG PO TABS
1.0000 | ORAL_TABLET | Freq: Two times a day (BID) | ORAL | 0 refills | Status: DC
  Filled 2021-09-28: qty 20, 10d supply, fill #0

## 2021-09-28 NOTE — Patient Instructions (Signed)
Please go downstairs for labs before you leave.   Take the oral antibiotics and use the ear drops as well.   Call your OB/GYN regarding your fibroids and abnormal uterine bleeding.

## 2021-09-28 NOTE — Progress Notes (Unsigned)
Subjective:     Patient ID: Sally Mitchell, female    DOB: [birth year], 51 y.o.   MRN: [id number]  Chief Complaint  Patient presents with  . Fatigue    Feeling overly tired and weak for the past 2 weeks  . heavy menstrual cycle     Would like iron checked as periods have been a lot heavier, hx of fibroids   . Headache    Headaches for about a week, states it is a different type of headache for her, head "feels funny." Has been using Advil.    HPI Patient is in today with complaints of headache and hearing heart beat in her left ear x 1 wk. Intermittent.  Advil did not help.   Fatigue x 2 wks.   Menstrual cycles are very heavy and she had 2 last month.    Dr. Terri Piedra is her OB/GYN   Health Maintenance Due  Topic Date Due  . HIV Screening  Never done  . Hepatitis C Screening  Never done  . Zoster Vaccines- Shingrix (1 of 2) Never done  . MAMMOGRAM  01/22/2021    Past Medical History:  Diagnosis Date  . Acne vulgaris   . Anemia   . Eczema    seborrhecic dermatitis  . FH: breast cancer   . Fibroids, submucosal   . GERD (gastroesophageal reflux disease)   . H/O dysmenorrhea   . H/O fatigue   . H/O hematuria   . H/O menorrhagia   . Hair loss   . Hiatal hernia   . History of kidney stones   . Hx of adenomatous polyp of colon 06/04/2020  . Hypercholesterolemia    mild  . Hypopotassemia   . Hypothyroidism   . Insomnia   . Iron deficiency anemia   . Irregular menses   . PVC (premature ventricular contraction)     Past Surgical History:  Procedure Laterality Date  . CESAREAN SECTION    . COLONOSCOPY    . DILATION AND CURETTAGE OF UTERUS    . ESOPHAGOGASTRODUODENOSCOPY  2008  . HYSTEROSCOPY  03/31/2004   with resection of endometrial fibroid  . WISDOM TOOTH EXTRACTION      Family History  Problem Relation Age of Onset  . Stroke Mother   . Hiatal hernia Mother   . Colon polyps Mother   . Heart disease Father   . Ulcers Father   . Breast cancer  Maternal Aunt   . Ovarian cancer Sister   . Colon cancer Neg Hx     Social History   Socioeconomic History  . Marital status: Widowed    Spouse name: Not on file  . Number of children: 1  . Years of education: Not on file  . Highest education level: Not on file  Occupational History  . Occupation: cma  Tobacco Use  . Smoking status: Never  . Smokeless tobacco: Never  Vaping Use  . Vaping Use: Never used  Substance and Sexual Activity  . Alcohol use: No  . Drug use: No  . Sexual activity: Not on file  Other Topics Concern  . Not on file  Social History Narrative   Widowed, husband died from complications of autoimmune liver disease    1 daughter   Employment as CMA Public house manager Dr. Ethlyn Gallery   No alcohol no caffeine no drugs no tobacco   Social Determinants of Radio broadcast assistant Strain: Not on file  Food Insecurity: Not on file  Transportation Needs:  Not on file  Physical Activity: Not on file  Stress: Not on file  Social Connections: Not on file  Intimate Partner Violence: Not on file    Outpatient Medications Prior to Visit  Medication Sig Dispense Refill  . cholecalciferol (VITAMIN D) 25 MCG (1000 UNIT) tablet Take 2,000 Units by mouth daily.    . Dapsone 5 % topical gel Apply as directed to skin twice a day 60 g 2  . ferrous sulfate 325 (65 FE) MG tablet Take 325 mg by mouth 3 (three) times a week.    . Fluocinolone Acetonide Scalp (DERMA-SMOOTHE/FS SCALP) 0.01 % OIL Apply 1 mL on the affected area(s) of the skin as directed. 118.28 mL 2  . fluticasone (FLONASE) 50 MCG/ACT nasal spray Place 2 sprays into both nostrils daily. 16 g 6  . levothyroxine (SYNTHROID) 112 MCG tablet TAKE 1 TABLET BY MOUTH ONCE DAILY BEFORE BREAKFAST 90 tablet 1  . nystatin-triamcinolone ointment (MYCOLOG) Apply 1 application topically 2 (two) times daily. 100 g 0  . vitamin B-12 (CYANOCOBALAMIN) 1000 MCG tablet Take 1,000 mcg by mouth daily.    . predniSONE (DELTASONE)  20 MG tablet Take 2 tablets (40 mg total) by mouth daily with breakfast. 10 tablet 0   No facility-administered medications prior to visit.    Allergies  Allergen Reactions  . Doxycycline Other (See Comments)    Pt get stomach cramps  . E-Mycin [Erythromycin Base]   . Septra [Bactrim]   . Sulfa Antibiotics Other (See Comments)    Pt throat closes up  . Sulfamethoxazole-Trimethoprim     ROS     Objective:    Physical Exam  BP 120/60 (BP Location: Left Arm, Patient Position: Sitting, Cuff Size: Large)   Pulse 65   Temp 97.8 F (36.6 C) (Temporal)   Ht '5\' 3"'$  (1.6 m)   Wt 165 lb (74.8 kg)   SpO2 99%   BMI 29.23 kg/m  Wt Readings from Last 3 Encounters:  09/28/21 165 lb (74.8 kg)  07/01/21 163 lb 12.8 oz (74.3 kg)  05/05/21 164 lb 6 oz (74.6 kg)       Assessment & Plan:   Problem List Items Addressed This Visit       Other   Fibroids   Temporal pain   Other Visit Diagnoses     Fatigue, unspecified type    -  Primary   Relevant Orders   CBC with Differential/Platelet (Completed)   Comprehensive metabolic panel (Completed)   TSH (Completed)   Iron, TIBC and Ferritin Panel (Completed)   Acute nonintractable headache, unspecified headache type       Acute otitis externa of left ear, unspecified type       Relevant Medications   amoxicillin-clavulanate (AUGMENTIN) 875-125 MG tablet   ciprofloxacin-dexamethasone (CIPRODEX) OTIC suspension   Abnormal uterine bleeding (AUB)       Relevant Orders   CBC with Differential/Platelet (Completed)   Comprehensive metabolic panel (Completed)   TSH (Completed)   Iron, TIBC and Ferritin Panel (Completed)       I have discontinued Rosine A. Navarette "JoAnne"'s predniSONE. I am also having her start on amoxicillin-clavulanate and ciprofloxacin-dexamethasone. Additionally, I am having her maintain her ferrous sulfate, cyanocobalamin, nystatin-triamcinolone ointment, Fluocinolone Acetonide Scalp, Dapsone, cholecalciferol,  fluticasone, and levothyroxine.  Meds ordered this encounter  Medications  . amoxicillin-clavulanate (AUGMENTIN) 875-125 MG tablet    Sig: Take 1 tablet by mouth 2 (two) times daily.    Dispense:  20 tablet  Refill:  0    Order Specific Question:   Supervising Provider    Answer:   Pricilla Holm A [[id number]]  . ciprofloxacin-dexamethasone (CIPRODEX) OTIC suspension    Sig: Place 4 drops into the left ear 2 (two) times daily.    Dispense:  7.5 mL    Refill:  0    Order Specific Question:   Supervising Provider    Answer:   Pricilla Holm A [[id number]]

## 2021-09-29 LAB — IRON,TIBC AND FERRITIN PANEL
%SAT: 13 % (calc) — ABNORMAL LOW (ref 16–45)
Ferritin: 16 ng/mL (ref 16–232)
Iron: 45 ug/dL (ref 45–160)
TIBC: 335 mcg/dL (calc) (ref 250–450)

## 2021-10-02 DIAGNOSIS — R519 Headache, unspecified: Secondary | ICD-10-CM | POA: Insufficient documentation

## 2021-10-02 DIAGNOSIS — H60502 Unspecified acute noninfective otitis externa, left ear: Secondary | ICD-10-CM | POA: Insufficient documentation

## 2021-10-02 DIAGNOSIS — R5383 Other fatigue: Secondary | ICD-10-CM | POA: Insufficient documentation

## 2021-10-02 DIAGNOSIS — N939 Abnormal uterine and vaginal bleeding, unspecified: Secondary | ICD-10-CM | POA: Insufficient documentation

## 2021-10-02 NOTE — Assessment & Plan Note (Signed)
Source of temporal pain most likely. Oral and topical steroids/abx. Continue NSAIDs. Follow up as needed

## 2021-10-02 NOTE — Assessment & Plan Note (Signed)
Likely r/t fibroids, heavy menses/AUB and acute otitis externa. Follow up pending labs

## 2021-10-02 NOTE — Assessment & Plan Note (Signed)
Large fibroids on abdominal exam. Strongly encouraged follow up with OB/GYN

## 2021-10-02 NOTE — Assessment & Plan Note (Signed)
Check labs including CBC, iron studies and follow up with Dr. Terri Piedra for large fibroids.

## 2021-10-02 NOTE — Assessment & Plan Note (Signed)
Most likely related to acute otitis externa. Dr. Ronnald Ramp also examined pt. No concern for temporal arteritis.  Treat with oral and topical abx and f/u as needed.

## 2021-11-10 ENCOUNTER — Encounter (HOSPITAL_COMMUNITY): Payer: Self-pay

## 2021-11-10 ENCOUNTER — Other Ambulatory Visit (HOSPITAL_COMMUNITY): Payer: Self-pay

## 2021-11-10 ENCOUNTER — Ambulatory Visit (HOSPITAL_COMMUNITY)
Admission: EM | Admit: 2021-11-10 | Discharge: 2021-11-10 | Disposition: A | Discharge status: Home / Self Care | Payer: 59 | Attending: Family Medicine | Admitting: Family Medicine

## 2021-11-10 ENCOUNTER — Ambulatory Visit (INDEPENDENT_AMBULATORY_CARE_PROVIDER_SITE_OTHER): Payer: 59

## 2021-11-10 DIAGNOSIS — R059 Cough, unspecified: Secondary | ICD-10-CM

## 2021-11-10 DIAGNOSIS — M549 Dorsalgia, unspecified: Secondary | ICD-10-CM | POA: Diagnosis not present

## 2021-11-10 DIAGNOSIS — R079 Chest pain, unspecified: Secondary | ICD-10-CM | POA: Diagnosis not present

## 2021-11-10 MED ORDER — KETOROLAC TROMETHAMINE 30 MG/ML IJ SOLN
INTRAMUSCULAR | Status: AC
  Filled 2021-11-10: qty 1

## 2021-11-10 MED ORDER — TIZANIDINE HCL 4 MG PO TABS
4.0000 mg | ORAL_TABLET | Freq: Three times a day (TID) | ORAL | 0 refills | Status: AC | PRN
  Filled 2021-11-10: qty 30, 10d supply, fill #0

## 2021-11-10 MED ORDER — KETOROLAC TROMETHAMINE 30 MG/ML IJ SOLN
30.0000 mg | Freq: Once | INTRAMUSCULAR | Status: AC
  Administered 2021-11-10: 30 mg via INTRAMUSCULAR

## 2021-11-10 MED ORDER — IBUPROFEN 800 MG PO TABS
800.0000 mg | ORAL_TABLET | Freq: Three times a day (TID) | ORAL | 0 refills | Status: AC | PRN
  Filled 2021-11-10: qty 21, 7d supply, fill #0

## 2021-11-10 NOTE — Discharge Instructions (Addendum)
Chest x-ray was clear and did not show any abnormality  You have been given a shot of Toradol 30 mg today.  Take ibuprofen 800 mg--1 tab every 8 hours as needed for pain.   Take tizanidine 4 mg--1 every 8 hours as needed for muscle spasms  Heating pad might help how your sore area feels

## 2021-11-10 NOTE — ED Triage Notes (Signed)
Onset 9:45 am this morning. Was sitting on the cough when the pain started. Left flank around to the back. No known falls or injuries, no medical history of back pain. Pain with movement. No changes to physical activities or heavy lifting.   Patient not having any urinary issues. Patient is constipated but this is chronic for her.

## 2021-11-10 NOTE — ED Provider Notes (Signed)
[facility]    CSN: [id number] Arrival date & time: 11/10/21  1105      History   Chief Complaint Chief Complaint  Patient presents with   Back Pain   Flank Pain    HPI Sally Mitchell is a 51 y.o. female.    Back Pain Flank Pain   Here for left upper back pain that began this morning suddenly.  She was sitting on the sofa and cleaning out her purse when she suddenly started having pain in her left upper back around her scapula.  No prior injury or trauma.  No known overuse.  No fever or chills or cough or congestion.  Movement or bending over does increase the pain as does breathing deeply  She is allergic to sulfa and doxycycline  Last menstrual cycle was in the last week  Past Medical History:  Diagnosis Date   Acne vulgaris    Anemia    Eczema    seborrhecic dermatitis   FH: breast cancer    Fibroids, submucosal    GERD (gastroesophageal reflux disease)    H/O dysmenorrhea    H/O fatigue    H/O hematuria    H/O menorrhagia    Hair loss    Hiatal hernia    History of kidney stones    Hx of adenomatous polyp of colon 06/04/2020   Hypercholesterolemia    mild   Hypopotassemia    Hypothyroidism    Insomnia    Iron deficiency anemia    Irregular menses    PVC (premature ventricular contraction)     Patient Active Problem List   Diagnosis Date Noted   Fatigue 10/02/2021   Acute nonintractable headache 10/02/2021   Abnormal uterine bleeding (AUB) 10/02/2021   Acute otitis externa of left ear 10/02/2021   Anemia 09/28/2021   Depressive disorder 09/28/2021   Menorrhagia 09/28/2021   Temporal pain 05/06/2021   Allergic rhinitis 05/06/2021   Candidal intertrigo 10/29/2020   Encounter for general adult medical examination with abnormal findings 10/29/2020   Hx of adenomatous polyp of colon 06/04/2020   PVC's (premature ventricular contractions) 04/19/2020   Hyperlipidemia 01/19/2020   Palpitations 12/29/2019   Seasonal allergies  04/22/2019   Fibroids 06/21/2011   Hypothyroidism 06/21/2011    Past Surgical History:  Procedure Laterality Date   CESAREAN SECTION     COLONOSCOPY     DILATION AND CURETTAGE OF UTERUS     ESOPHAGOGASTRODUODENOSCOPY  2008   HYSTEROSCOPY  03/31/2004   with resection of endometrial fibroid   WISDOM TOOTH EXTRACTION      OB History   No obstetric history on file.      Home Medications    Prior to Admission medications   Medication Sig Start Date End Date Taking? Authorizing Provider  cholecalciferol (VITAMIN D) 25 MCG (1000 UNIT) tablet Take 2,000 Units by mouth daily. 12/17/20  Yes [provider]  Dapsone 5 % topical gel Apply as directed to skin twice a day 02/10/21  Yes   ferrous sulfate 325 (65 FE) MG tablet Take 325 mg by mouth 3 (three) times a week.   Yes [provider]  Fluocinolone Acetonide Scalp (DERMA-SMOOTHE/FS SCALP) 0.01 % OIL Apply 1 mL on the affected area(s) of the skin as directed. 12/17/20  Yes   fluticasone (FLONASE) 50 MCG/ACT nasal spray Place 2 sprays into both nostrils daily. 05/05/21  Yes Hoyt Koch, MD  ibuprofen (ADVIL) 800 MG tablet Take 1 tablet (800 mg total) by  mouth every 8 (eight) hours as needed (pain). 11/10/21  Yes Barrett Henle, MD  levothyroxine (SYNTHROID) 112 MCG tablet TAKE 1 TABLET BY MOUTH ONCE DAILY BEFORE BREAKFAST 08/11/21 08/11/22 Yes Hoyt Koch, MD  tiZANidine (ZANAFLEX) 4 MG tablet Take 1 tablet (4 mg total) by mouth every 8 (eight) hours as needed for muscle spasms. 11/10/21  Yes Darlyn Repsher, Gwenlyn Perking, MD  vitamin B-12 (CYANOCOBALAMIN) 1000 MCG tablet Take 1,000 mcg by mouth daily.   Yes [provider]    Family History Family History  Problem Relation Age of Onset   Stroke Mother    Hiatal hernia Mother    Colon polyps Mother    Heart disease Father    Ulcers Father    Breast cancer Maternal Aunt    Ovarian cancer Sister    Colon cancer Neg Hx     Social History Social  History   Tobacco Use   Smoking status: Never   Smokeless tobacco: Never  Vaping Use   Vaping Use: Never used  Substance Use Topics   Alcohol use: No   Drug use: No     Allergies   Doxycycline, E-mycin [erythromycin base], Septra [bactrim], Sulfa antibiotics, and Sulfamethoxazole-trimethoprim   Review of Systems Review of Systems  Genitourinary:  Positive for flank pain.  Musculoskeletal:  Positive for back pain.     Physical Exam Triage Vital Signs ED Triage Vitals  Enc Vitals Group     BP 11/10/21 1208 122/74     Pulse Rate 11/10/21 1208 63     Resp 11/10/21 1208 16     Temp 11/10/21 1208 98.1 F (36.7 C)     Temp Source 11/10/21 1208 Oral     SpO2 11/10/21 1208 99 %     Weight --      Height --      Head Circumference --      Peak Flow --      Pain Score 11/10/21 1210 10     Pain Loc --      Pain Edu? --      Excl. in [place]? --    No data found.  Updated Vital Signs BP 122/74 (BP Location: Right Arm)   Pulse 63   Temp 98.1 F (36.7 C) (Oral)   Resp 16   LMP 11/04/2021 (Exact Date)   SpO2 99%   Visual Acuity Right Eye Distance:   Left Eye Distance:   Bilateral Distance:    Right Eye Near:   Left Eye Near:    Bilateral Near:     Physical Exam Vitals reviewed.  Constitutional:      General: She is not in acute distress.    Appearance: She is not ill-appearing, toxic-appearing or diaphoretic.  HENT:     Mouth/Throat:     Mouth: Mucous membranes are moist.     Pharynx: No oropharyngeal exudate or posterior oropharyngeal erythema.  Eyes:     Extraocular Movements: Extraocular movements intact.     Pupils: Pupils are equal, round, and reactive to light.  Cardiovascular:     Rate and Rhythm: Normal rate and regular rhythm.     Heart sounds: No murmur heard. Pulmonary:     Breath sounds: No stridor. No wheezing, rhonchi or rales.  Chest:     Chest wall: Tenderness (Her left upper back is tender, and when she does deep breathing for the lung  exam, it causes her some increased pain.) present.  Neurological:     General:  No focal deficit present.     Mental Status: She is alert and oriented to person, place, and time.  Psychiatric:        Behavior: Behavior normal.      UC Treatments / Results  Labs (all labs ordered are listed, but only abnormal results are displayed) Labs Reviewed - No data to display  EKG   Radiology DG Chest 2 View  Result Date: 11/10/2021 CLINICAL DATA:  Cough, back pain EXAM: CHEST - 2 VIEW COMPARISON:  04/01/2013 FINDINGS: Transverse diameter of heart is slightly increased. There are no signs of pulmonary edema or focal pulmonary consolidation. There is no pleural effusion or pneumothorax. IMPRESSION: There are no signs of pulmonary edema or focal pulmonary consolidation. Electronically Signed   By: Elmer Picker M.D.   On: 11/10/2021 12:55    Procedures Procedures (including critical care time)  Medications Ordered in UC Medications  ketorolac (TORADOL) 30 MG/ML injection 30 mg (has no administration in time range)    Initial Impression / Assessment and Plan / UC Course  I have reviewed the triage vital signs and the nursing notes.  Pertinent labs & imaging results that were available during my care of the patient were reviewed by me and considered in my medical decision making (see chart for details).        This x-ray is clear and does not show any bony abnormality either.  We will treat for pain and with muscle relaxers. Final Clinical Impressions(s) / UC Diagnoses   Final diagnoses:  Upper back pain on left side     Discharge Instructions      Chest x-ray was clear and did not show any abnormality  You have been given a shot of Toradol 30 mg today.  Take ibuprofen 800 mg--1 tab every 8 hours as needed for pain.   Take tizanidine 4 mg--1 every 8 hours as needed for muscle spasms  Heating pad might help how your sore area feels     ED Prescriptions      Medication Sig Dispense Auth. Provider   ibuprofen (ADVIL) 800 MG tablet Take 1 tablet (800 mg total) by mouth every 8 (eight) hours as needed (pain). 21 tablet Jiovanny Burdell, Gwenlyn Perking, MD   tiZANidine (ZANAFLEX) 4 MG tablet Take 1 tablet (4 mg total) by mouth every 8 (eight) hours as needed for muscle spasms. 30 tablet Dabney Schanz, Gwenlyn Perking, MD      PDMP not reviewed this encounter.   Barrett Henle, MD 11/10/21 1309

## 2021-11-16 ENCOUNTER — Other Ambulatory Visit (HOSPITAL_COMMUNITY): Payer: Self-pay

## 2021-12-22 DIAGNOSIS — H5213 Myopia, bilateral: Secondary | ICD-10-CM | POA: Diagnosis not present

## 2022-02-09 ENCOUNTER — Other Ambulatory Visit: Payer: Self-pay | Admitting: Internal Medicine

## 2022-02-09 ENCOUNTER — Other Ambulatory Visit (HOSPITAL_COMMUNITY): Payer: Self-pay

## 2022-02-09 DIAGNOSIS — E039 Hypothyroidism, unspecified: Secondary | ICD-10-CM

## 2022-02-14 ENCOUNTER — Other Ambulatory Visit (HOSPITAL_COMMUNITY): Payer: Self-pay

## 2022-02-14 MED ORDER — LEVOTHYROXINE SODIUM 112 MCG PO TABS
112.0000 ug | ORAL_TABLET | Freq: Every day | ORAL | 1 refills | Status: DC
  Filled 2022-02-14: qty 90, 90d supply, fill #0
  Filled 2022-05-11 – 2022-05-19 (×2): qty 90, 90d supply, fill #1

## 2022-04-07 ENCOUNTER — Other Ambulatory Visit (HOSPITAL_COMMUNITY): Payer: Self-pay

## 2022-04-08 ENCOUNTER — Other Ambulatory Visit (HOSPITAL_COMMUNITY): Payer: Self-pay

## 2022-05-04 ENCOUNTER — Ambulatory Visit (INDEPENDENT_AMBULATORY_CARE_PROVIDER_SITE_OTHER): Payer: 59 | Admitting: Internal Medicine

## 2022-05-04 ENCOUNTER — Other Ambulatory Visit (HOSPITAL_COMMUNITY): Payer: Self-pay

## 2022-05-04 ENCOUNTER — Encounter: Payer: Self-pay | Admitting: Internal Medicine

## 2022-05-04 VITALS — BP 120/60 | HR 70 | Temp 98.4°F | Ht 63.0 in | Wt 161.0 lb

## 2022-05-04 DIAGNOSIS — E039 Hypothyroidism, unspecified: Secondary | ICD-10-CM

## 2022-05-04 DIAGNOSIS — R519 Headache, unspecified: Secondary | ICD-10-CM | POA: Diagnosis not present

## 2022-05-04 DIAGNOSIS — D649 Anemia, unspecified: Secondary | ICD-10-CM | POA: Diagnosis not present

## 2022-05-04 LAB — COMPREHENSIVE METABOLIC PANEL
ALT: 11 U/L (ref 0–35)
AST: 17 U/L (ref 0–37)
Albumin: 4.1 g/dL (ref 3.5–5.2)
Alkaline Phosphatase: 50 U/L (ref 39–117)
BUN: 15 mg/dL (ref 6–23)
CO2: 27 mEq/L (ref 19–32)
Calcium: 9.4 mg/dL (ref 8.4–10.5)
Chloride: 103 mEq/L (ref 96–112)
Creatinine, Ser: 0.69 mg/dL (ref 0.40–1.20)
GFR: 99.97 mL/min (ref >60.00)
Glucose, Bld: 82 mg/dL (ref 70–99)
Potassium: 3.5 mEq/L (ref 3.5–5.1)
Sodium: 139 mEq/L (ref 135–145)
Total Bilirubin: 0.9 mg/dL (ref 0.2–1.2)
Total Protein: 7.5 g/dL (ref 6.0–8.3)

## 2022-05-04 LAB — CBC
HCT: 40 % (ref 36.0–46.0)
Hemoglobin: 13.7 g/dL (ref 12.0–15.0)
MCHC: 34.3 g/dL (ref 30.0–36.0)
MCV: 94.3 fl (ref 78.0–100.0)
Platelets: 193 10*3/uL (ref 150.0–400.0)
RBC: 4.24 Mil/uL (ref 3.87–5.11)
RDW: 13 % (ref 11.5–15.5)
WBC: 3.1 10*3/uL — ABNORMAL LOW (ref 4.0–10.5)

## 2022-05-04 LAB — TSH: TSH: 1.23 u[IU]/mL (ref 0.35–5.50)

## 2022-05-04 LAB — VITAMIN B12: Vitamin B-12: >1500 pg/mL — ABNORMAL HIGH (ref 211–911)

## 2022-05-04 LAB — VITAMIN D 25 HYDROXY (VIT D DEFICIENCY, FRACTURES): VITD: 30.3 ng/mL (ref 30.00–100.00)

## 2022-05-04 MED ORDER — TRIAMCINOLONE ACETONIDE 0.1 % EX CREA
1.0000 | TOPICAL_CREAM | Freq: Two times a day (BID) | CUTANEOUS | 0 refills | Status: DC
  Filled 2022-05-04 – 2022-05-18 (×2): qty 80, 40d supply, fill #0

## 2022-05-04 NOTE — Progress Notes (Signed)
   Subjective:   Patient ID: Sally Mitchell, female    DOB: Apr 03, 1970, 52 y.o.   MRN: [id number]  HPI The patient is a 52 YO female coming in for follow up.   Review of Systems  Constitutional: Negative.   HENT: Negative.    Eyes: Negative.   Respiratory:  Negative for cough, chest tightness and shortness of breath.   Cardiovascular:  Negative for chest pain, palpitations and leg swelling.  Gastrointestinal:  Negative for abdominal distention, abdominal pain, constipation, diarrhea, nausea and vomiting.  Musculoskeletal: Negative.   Skin: Negative.   Neurological:  Positive for headaches.  Psychiatric/Behavioral: Negative.      Objective:  Physical Exam Constitutional:      Appearance: She is well-developed.  HENT:     Head: Normocephalic and atraumatic.  Cardiovascular:     Rate and Rhythm: Normal rate and regular rhythm.  Pulmonary:     Effort: Pulmonary effort is normal. No respiratory distress.     Breath sounds: Normal breath sounds. No wheezing or rales.  Abdominal:     General: Bowel sounds are normal. There is no distension.     Palpations: Abdomen is soft.     Tenderness: There is no abdominal tenderness. There is no rebound.  Musculoskeletal:     Cervical back: Normal range of motion.  Skin:    General: Skin is warm and dry.  Neurological:     Mental Status: She is alert and oriented to person, place, and time.     Coordination: Coordination normal.     Vitals:   05/04/22 0844  BP: 120/60  Pulse: 70  Temp: 98.4 F (36.9 C)  TempSrc: Oral  SpO2: 98%  Weight: 161 lb (73 kg)  Height: 5\' 3"  (1.6 m)    Assessment & Plan:

## 2022-05-05 NOTE — Assessment & Plan Note (Signed)
Some headaches in the last few weeks. Using otc with good relief.

## 2022-05-05 NOTE — Assessment & Plan Note (Addendum)
Checking CBC and adjust as needed. Is still having excessive bleeding. Checking B12 and vitamin D as well.

## 2022-05-05 NOTE — Assessment & Plan Note (Signed)
Checking TSH and adjust synthroid 112 mcg daily as needed.  

## 2022-05-11 ENCOUNTER — Other Ambulatory Visit (HOSPITAL_COMMUNITY): Payer: Self-pay

## 2022-05-12 ENCOUNTER — Other Ambulatory Visit (HOSPITAL_COMMUNITY): Payer: Self-pay

## 2022-05-18 ENCOUNTER — Other Ambulatory Visit (HOSPITAL_COMMUNITY): Payer: Self-pay

## 2022-05-19 ENCOUNTER — Other Ambulatory Visit (HOSPITAL_COMMUNITY): Payer: Self-pay

## 2022-06-23 ENCOUNTER — Other Ambulatory Visit (HOSPITAL_COMMUNITY): Payer: Self-pay

## 2022-06-23 ENCOUNTER — Other Ambulatory Visit: Payer: Self-pay | Admitting: Internal Medicine

## 2022-06-23 MED ORDER — FLUOCINOLONE ACETONIDE SCALP 0.01 % EX OIL
TOPICAL_OIL | CUTANEOUS | 0 refills | Status: DC
  Filled 2022-06-23: qty 118.28, 30d supply, fill #0

## 2022-07-06 ENCOUNTER — Encounter: Payer: Self-pay | Admitting: Internal Medicine

## 2022-07-06 ENCOUNTER — Ambulatory Visit: Payer: 59 | Admitting: Internal Medicine

## 2022-07-06 ENCOUNTER — Other Ambulatory Visit (HOSPITAL_COMMUNITY): Payer: Self-pay

## 2022-07-06 VITALS — BP 120/80 | HR 61 | Temp 98.3°F | Ht 63.0 in | Wt 162.0 lb

## 2022-07-06 DIAGNOSIS — Z0001 Encounter for general adult medical examination with abnormal findings: Secondary | ICD-10-CM

## 2022-07-06 DIAGNOSIS — E039 Hypothyroidism, unspecified: Secondary | ICD-10-CM | POA: Diagnosis not present

## 2022-07-06 DIAGNOSIS — E785 Hyperlipidemia, unspecified: Secondary | ICD-10-CM

## 2022-07-06 DIAGNOSIS — N939 Abnormal uterine and vaginal bleeding, unspecified: Secondary | ICD-10-CM

## 2022-07-06 LAB — LIPID PANEL
Cholesterol: 217 mg/dL — ABNORMAL HIGH (ref 0–200)
HDL: 60.7 mg/dL (ref >39.00)
LDL Cholesterol: 145 mg/dL — ABNORMAL HIGH (ref 0–99)
NonHDL: 156.28
Total CHOL/HDL Ratio: 4
Triglycerides: 56 mg/dL (ref 0.0–149.0)
VLDL: 11.2 mg/dL (ref 0.0–40.0)

## 2022-07-06 MED ORDER — FLUOCINOLONE ACETONIDE SCALP 0.01 % EX OIL
TOPICAL_OIL | CUTANEOUS | 11 refills | Status: DC
  Filled 2022-07-06: qty 118.28, fill #0

## 2022-07-06 MED ORDER — FLUTICASONE PROPIONATE 50 MCG/ACT NA SUSP
2.0000 | Freq: Every day | NASAL | 6 refills | Status: DC
  Filled 2022-07-06 – 2022-11-15 (×2): qty 16, 30d supply, fill #0
  Filled 2023-02-08: qty 16, 30d supply, fill #1
  Filled 2023-05-17: qty 16, 30d supply, fill #2

## 2022-07-06 MED ORDER — LEVOTHYROXINE SODIUM 112 MCG PO TABS
112.0000 ug | ORAL_TABLET | Freq: Every day | ORAL | 3 refills | Status: DC
  Filled 2022-07-06 – 2022-08-18 (×2): qty 90, 90d supply, fill #0
  Filled 2022-11-15: qty 90, 90d supply, fill #1
  Filled 2023-02-08: qty 90, 90d supply, fill #2
  Filled 2023-05-17: qty 90, 90d supply, fill #3

## 2022-07-06 NOTE — Progress Notes (Signed)
   Subjective:   Patient ID: Sally Mitchell, female    DOB: 11/26/1970, 52 y.o.   MRN: [id number]  HPI The patient is here for physical.  PMH, [facility], social history reviewed and updated  Review of Systems  Constitutional: Negative.   HENT: Negative.    Eyes: Negative.   Respiratory:  Negative for cough, chest tightness and shortness of breath.   Cardiovascular:  Negative for chest pain, palpitations and leg swelling.  Gastrointestinal:  Negative for abdominal distention, abdominal pain, constipation, diarrhea, nausea and vomiting.  Musculoskeletal: Negative.   Skin: Negative.   Neurological:  Positive for headaches.  Psychiatric/Behavioral: Negative.      Objective:  Physical Exam Constitutional:      Appearance: She is well-developed.  HENT:     Head: Normocephalic and atraumatic.  Cardiovascular:     Rate and Rhythm: Normal rate and regular rhythm.  Pulmonary:     Effort: Pulmonary effort is normal. No respiratory distress.     Breath sounds: Normal breath sounds. No wheezing or rales.  Abdominal:     General: Bowel sounds are normal. There is no distension.     Palpations: Abdomen is soft.     Tenderness: There is no abdominal tenderness. There is no rebound.  Musculoskeletal:     Cervical back: Normal range of motion.  Skin:    General: Skin is warm and dry.  Neurological:     Mental Status: She is alert and oriented to person, place, and time.     Coordination: Coordination normal.     Vitals:   07/06/22 0839  BP: 120/80  Pulse: 61  Temp: 98.3 F (36.8 C)  TempSrc: Oral  SpO2: 99%  Weight: 162 lb (73.5 kg)  Height: 5\' 3"  (1.6 m)    Assessment & Plan:

## 2022-07-07 ENCOUNTER — Encounter: Payer: Commercial Managed Care - PPO | Admitting: Internal Medicine

## 2022-07-07 ENCOUNTER — Other Ambulatory Visit (HOSPITAL_COMMUNITY): Payer: Self-pay

## 2022-07-07 NOTE — Assessment & Plan Note (Signed)
Checking lipid panel and adjust as needed. Diet controlled currently.  °

## 2022-07-07 NOTE — Assessment & Plan Note (Signed)
Refilled synthroid 112 mcg daily and labs up to date. Continue at same dosing.

## 2022-07-07 NOTE — Assessment & Plan Note (Signed)
Has not followed up with gyn however CBC stable recently. She has had some improvement in bleeding. We discussed she may be getting into menopause/premenopause which will reduce bleeding long term. I asked her to follow up with gyn.

## 2022-07-07 NOTE — Assessment & Plan Note (Signed)
Flu shot yearly. Shingrix declines. Tetanus up to date. Colonoscopy up to date. Mammogram up to date, pap smear up to date. Counseled about sun safety and mole surveillance. Counseled about the dangers of distracted driving. Given 10 year screening recommendations.   

## 2022-07-10 ENCOUNTER — Other Ambulatory Visit (HOSPITAL_COMMUNITY): Payer: Self-pay

## 2022-07-14 ENCOUNTER — Other Ambulatory Visit (HOSPITAL_COMMUNITY): Payer: Self-pay

## 2022-08-18 ENCOUNTER — Other Ambulatory Visit (HOSPITAL_COMMUNITY): Payer: Self-pay

## 2022-09-28 ENCOUNTER — Other Ambulatory Visit (HOSPITAL_COMMUNITY): Payer: Self-pay

## 2022-09-28 MED ORDER — AMOXICILLIN 500 MG PO CAPS
500.0000 mg | ORAL_CAPSULE | Freq: Three times a day (TID) | ORAL | 0 refills | Status: DC
  Filled 2022-09-28: qty 21, 7d supply, fill #0

## 2022-11-15 ENCOUNTER — Other Ambulatory Visit (HOSPITAL_COMMUNITY): Payer: Self-pay

## 2022-11-16 ENCOUNTER — Other Ambulatory Visit (HOSPITAL_COMMUNITY): Payer: Self-pay

## 2023-01-16 ENCOUNTER — Other Ambulatory Visit (HOSPITAL_COMMUNITY): Payer: Self-pay

## 2023-01-16 ENCOUNTER — Telehealth: Payer: 59 | Admitting: Physician Assistant

## 2023-01-16 DIAGNOSIS — J020 Streptococcal pharyngitis: Secondary | ICD-10-CM | POA: Diagnosis not present

## 2023-01-16 MED ORDER — AMOXICILLIN 500 MG PO CAPS
500.0000 mg | ORAL_CAPSULE | Freq: Two times a day (BID) | ORAL | 0 refills | Status: AC
  Filled 2023-01-16: qty 20, 10d supply, fill #0

## 2023-01-16 NOTE — Progress Notes (Signed)

## 2023-01-16 NOTE — Progress Notes (Signed)
I have spent 5 minutes in review of e-visit questionnaire, review and updating patient chart, medical decision making and response to patient.   Mia Milan Cody Jacklynn Dehaas, PA-C    

## 2023-01-26 ENCOUNTER — Ambulatory Visit (INDEPENDENT_AMBULATORY_CARE_PROVIDER_SITE_OTHER): Payer: 59 | Admitting: Internal Medicine

## 2023-01-26 ENCOUNTER — Encounter: Payer: Self-pay | Admitting: Internal Medicine

## 2023-01-26 VITALS — BP 122/64 | HR 70 | Temp 98.2°F | Ht 63.0 in | Wt 166.0 lb

## 2023-01-26 DIAGNOSIS — E039 Hypothyroidism, unspecified: Secondary | ICD-10-CM

## 2023-01-26 DIAGNOSIS — N939 Abnormal uterine and vaginal bleeding, unspecified: Secondary | ICD-10-CM

## 2023-01-26 DIAGNOSIS — R002 Palpitations: Secondary | ICD-10-CM | POA: Diagnosis not present

## 2023-01-26 LAB — CBC
HCT: 37.3 % (ref 36.0–46.0)
Hemoglobin: 12.9 g/dL (ref 12.0–15.0)
MCHC: 34.6 g/dL (ref 30.0–36.0)
MCV: 96.7 fL (ref 78.0–100.0)
Platelets: 221 10*3/uL (ref 150.0–400.0)
RBC: 3.85 Mil/uL — ABNORMAL LOW (ref 3.87–5.11)
RDW: 13.6 % (ref 11.5–15.5)
WBC: 3.3 10*3/uL — ABNORMAL LOW (ref 4.0–10.5)

## 2023-01-26 LAB — COMPREHENSIVE METABOLIC PANEL
ALT: 11 U/L (ref 0–35)
AST: 17 U/L (ref 0–37)
Albumin: 4.2 g/dL (ref 3.5–5.2)
Alkaline Phosphatase: 49 U/L (ref 39–117)
BUN: 15 mg/dL (ref 6–23)
CO2: 29 meq/L (ref 19–32)
Calcium: 8.9 mg/dL (ref 8.4–10.5)
Chloride: 104 meq/L (ref 96–112)
Creatinine, Ser: 0.71 mg/dL (ref 0.40–1.20)
GFR: 97.44 mL/min (ref >60.00)
Glucose, Bld: 77 mg/dL (ref 70–99)
Potassium: 3.7 meq/L (ref 3.5–5.1)
Sodium: 139 meq/L (ref 135–145)
Total Bilirubin: 0.7 mg/dL (ref 0.2–1.2)
Total Protein: 7.4 g/dL (ref 6.0–8.3)

## 2023-01-26 LAB — TSH: TSH: 3.3 u[IU]/mL (ref 0.35–5.50)

## 2023-01-26 LAB — FERRITIN: Ferritin: 11.9 ng/mL (ref 10.0–291.0)

## 2023-01-26 NOTE — Assessment & Plan Note (Signed)
With new palpitations needs TSH and adjust levothyroxine 112 mcg daily as needed.

## 2023-01-26 NOTE — Assessment & Plan Note (Signed)
Heavy periods lately and with new palpitations and some SOB with talking a lot could have low iron. Checking CBC and ferritin and adjust as needed.

## 2023-01-26 NOTE — Progress Notes (Signed)
   Subjective:   Patient ID: Sally Mitchell, female    DOB: 06/05/1970, 52 y.o.   MRN: [id number]  HPI The patient is a 52 YO female coming in for palpitations. Prior hx PVCs unsure trigger started around thanksgiving.   Review of Systems  Constitutional: Negative.   HENT: Negative.    Eyes: Negative.   Respiratory:  Negative for cough, chest tightness and shortness of breath.   Cardiovascular:  Positive for palpitations. Negative for chest pain and leg swelling.  Gastrointestinal:  Negative for abdominal distention, abdominal pain, constipation, diarrhea, nausea and vomiting.  Musculoskeletal: Negative.   Skin: Negative.   Neurological: Negative.   Psychiatric/Behavioral: Negative.      Objective:  Physical Exam Constitutional:      Appearance: She is well-developed.  HENT:     Head: Normocephalic and atraumatic.  Cardiovascular:     Rate and Rhythm: Normal rate and regular rhythm.  Pulmonary:     Effort: Pulmonary effort is normal. No respiratory distress.     Breath sounds: Normal breath sounds. No wheezing or rales.  Abdominal:     General: Bowel sounds are normal. There is no distension.     Palpations: Abdomen is soft.     Tenderness: There is no abdominal tenderness. There is no rebound.  Musculoskeletal:     Cervical back: Normal range of motion.  Skin:    General: Skin is warm and dry.  Neurological:     Mental Status: She is alert and oriented to person, place, and time.     Coordination: Coordination normal.     Vitals:   01/26/23 0923  BP: 122/64  Pulse: 70  Temp: 98.2 F (36.8 C)  TempSrc: Oral  SpO2: 99%  Weight: 166 lb (75.3 kg)  Height: 5\' 3"  (1.6 m)   EKG: Rate 63, axis left, interval normal, sinus, no change in st or t wave changes, read stating possible septal infarct not accurate, previously present no significant change compared to prior 2021  Assessment & Plan:

## 2023-01-26 NOTE — Assessment & Plan Note (Signed)
EKG stable from prior without PVCs. She had done monitor with PVCs prior. Will check TSH, CBC, ferritin, CMP. She has heavy periods and may have low iron or thyroid change (has low thyroid takes levothyroxine).

## 2023-02-08 ENCOUNTER — Other Ambulatory Visit (HOSPITAL_COMMUNITY): Payer: Self-pay

## 2023-04-18 ENCOUNTER — Telehealth: Payer: Self-pay | Admitting: Internal Medicine

## 2023-04-18 NOTE — Telephone Encounter (Signed)
 Copied from CRM [id number]. Topic: Referral - Question >> Apr 18, 2023 12:36 PM Kathryne Eriksson wrote: Reason for CRM: Referral To Dermatologist >> Apr 18, 2023 12:40 PM Kathryne Eriksson wrote: Patient called in stating to be experiencing some scalp pain and hair loss, and was wanting to know if Myrlene Broker, MD could put her in a referral to [facility]. Patient call back number is [phone]

## 2023-04-19 NOTE — Telephone Encounter (Signed)
 Generally all referrals need apt with [place] first.  If patient insurance does not require referral she can make apt with any practice in her network.

## 2023-05-17 ENCOUNTER — Other Ambulatory Visit (HOSPITAL_COMMUNITY): Payer: Self-pay

## 2023-05-18 ENCOUNTER — Other Ambulatory Visit (HOSPITAL_COMMUNITY): Payer: Self-pay

## 2023-05-19 ENCOUNTER — Other Ambulatory Visit (HOSPITAL_COMMUNITY): Payer: Self-pay

## 2023-06-06 ENCOUNTER — Other Ambulatory Visit: Payer: Self-pay | Admitting: Obstetrics and Gynecology

## 2023-06-06 DIAGNOSIS — Z1231 Encounter for screening mammogram for malignant neoplasm of breast: Secondary | ICD-10-CM

## 2023-06-08 ENCOUNTER — Ambulatory Visit
Admission: RE | Admit: 2023-06-08 | Discharge: 2023-06-08 | Disposition: A | Discharge status: Home / Self Care | Source: Ambulatory Visit | Attending: Obstetrics and Gynecology | Admitting: Obstetrics and Gynecology

## 2023-06-08 DIAGNOSIS — Z1231 Encounter for screening mammogram for malignant neoplasm of breast: Secondary | ICD-10-CM | POA: Diagnosis not present

## 2023-06-09 ENCOUNTER — Other Ambulatory Visit: Payer: Self-pay

## 2023-06-09 ENCOUNTER — Ambulatory Visit (HOSPITAL_COMMUNITY)
Admission: EM | Admit: 2023-06-09 | Discharge: 2023-06-09 | Disposition: A | Discharge status: Home / Self Care | Attending: Physician Assistant | Admitting: Physician Assistant

## 2023-06-09 ENCOUNTER — Other Ambulatory Visit (HOSPITAL_COMMUNITY): Payer: Self-pay

## 2023-06-09 ENCOUNTER — Encounter (HOSPITAL_COMMUNITY): Payer: Self-pay | Admitting: Emergency Medicine

## 2023-06-09 DIAGNOSIS — R519 Headache, unspecified: Secondary | ICD-10-CM | POA: Diagnosis not present

## 2023-06-09 DIAGNOSIS — J029 Acute pharyngitis, unspecified: Secondary | ICD-10-CM | POA: Insufficient documentation

## 2023-06-09 LAB — CBC WITH DIFFERENTIAL/PLATELET
Abs Immature Granulocytes: 0.01 10*3/uL (ref 0.00–0.07)
Basophils Absolute: 0 10*3/uL (ref 0.0–0.1)
Basophils Relative: 1 %
Eosinophils Absolute: 0.1 10*3/uL (ref 0.0–0.5)
Eosinophils Relative: 2 %
HCT: 35.8 % — ABNORMAL LOW (ref 36.0–46.0)
Hemoglobin: 11.7 g/dL — ABNORMAL LOW (ref 12.0–15.0)
Immature Granulocytes: 0 %
Lymphocytes Relative: 23 %
Lymphs Abs: 0.7 10*3/uL (ref 0.7–4.0)
MCH: 29.5 pg (ref 26.0–34.0)
MCHC: 32.7 g/dL (ref 30.0–36.0)
MCV: 90.4 fL (ref 80.0–100.0)
Monocytes Absolute: 0.4 10*3/uL (ref 0.1–1.0)
Monocytes Relative: 13 %
Neutro Abs: 2 10*3/uL (ref 1.7–7.7)
Neutrophils Relative %: 61 %
Platelets: 229 10*3/uL (ref 150–400)
RBC: 3.96 MIL/uL (ref 3.87–5.11)
RDW: 13.3 % (ref 11.5–15.5)
WBC: 3.3 10*3/uL — ABNORMAL LOW (ref 4.0–10.5)
nRBC: 0 % (ref 0.0–0.2)

## 2023-06-09 LAB — BASIC METABOLIC PANEL WITH GFR
Anion gap: 9 (ref 5–15)
BUN: 12 mg/dL (ref 6–20)
CO2: 23 mmol/L (ref 22–32)
Calcium: 9.3 mg/dL (ref 8.9–10.3)
Chloride: 108 mmol/L (ref 98–111)
Creatinine, Ser: 0.68 mg/dL (ref 0.44–1.00)
GFR, Estimated: >60 mL/min (ref >60)
Glucose, Bld: 89 mg/dL (ref 70–99)
Potassium: 4 mmol/L (ref 3.5–5.1)
Sodium: 140 mmol/L (ref 135–145)

## 2023-06-09 LAB — C-REACTIVE PROTEIN: CRP: <0.5 mg/dL (ref <1.0)

## 2023-06-09 LAB — POC COVID19/FLU A&B COMBO
Covid Antigen, POC: NEGATIVE
Influenza A Antigen, POC: NEGATIVE
Influenza B Antigen, POC: NEGATIVE

## 2023-06-09 LAB — SEDIMENTATION RATE: Sed Rate: 19 mm/h (ref 0–22)

## 2023-06-09 LAB — POCT RAPID STREP A (OFFICE): Rapid Strep A Screen: NEGATIVE

## 2023-06-09 MED ORDER — PREDNISONE 10 MG (21) PO TBPK
ORAL_TABLET | ORAL | 0 refills | Status: DC
  Filled 2023-06-09: qty 21, 6d supply, fill #0

## 2023-06-09 NOTE — Discharge Instructions (Signed)
 You are negative for strep, COVID, flu.  I am sending your throat culture off and we will contact you if this grows any bacteria we need to start antibiotic.  Given all of your symptoms, I would like to start prednisone  to help with the pain and inflammation.  Do not take NSAIDs with this medication including aspirin, ibuprofen /Advil , naproxen/Aleve.  Continue with nasal saline and sinus rinses as well as Mucinex.  As we discussed, sometimes headache over your temple can be concerning for inflammation of a blood vessel.  The prednisone  should help with this but if any of your blood work is abnormal we will have to arrange follow-up with a vascular provider.  I will contact you if any of this is abnormal.  If anything changes and you have a severe headache, fever, vision change, nausea, vomiting, difficulty swallowing, swelling of your throat you need to go to the emergency room immediately.  Please follow-up with your primary care soon as possible.

## 2023-06-09 NOTE — ED Provider Notes (Signed)
 [facility]    CSN: [id number] Arrival date & time: 06/09/23  1000      History   Chief Complaint Chief Complaint  Patient presents with   Sore Throat    HPI Sally Mitchell is a 53 y.o. female.   Patient presents today with a 2-day history of sore throat.  She reports associated headache that is worse on the left side.  Headache pain is rated 5 on a 0-10 pain scale, described as a throbbing, no aggravating or alleviating factors identified.  She has tried over-the-counter analgesics without improvement of symptoms.  She has had some mild congestion but attributes this to seasonal allergies.  She denies any chest pain, shortness of breath, fever, nausea, vomiting, diarrhea, significant cough.  She denies any swelling of her throat, shortness of breath, dysphagia.  It is painful when she attempts to swallow but she is able to manage her secretions.  She was treated for strep pharyngitis a few months ago and reports that her sore throat feels similar but the headache is different than what she is experienced in the past.  She denies any visual disturbance.  Denies any history of temporal arteritis.  She denies any recent antibiotics or steroids.  She has not had COVID in the past 90 days.  She has had COVID-19 vaccinations.    Past Medical History:  Diagnosis Date   Acne vulgaris    Anemia    Eczema    seborrhecic dermatitis   FH: breast cancer    Fibroids, submucosal    GERD (gastroesophageal reflux disease)    H/O dysmenorrhea    H/O fatigue    H/O hematuria    H/O menorrhagia    Hair loss    Hiatal hernia    History of kidney stones    Hx of adenomatous polyp of colon 06/04/2020   Hypercholesterolemia    mild   Hypopotassemia    Hypothyroidism    Insomnia    Iron deficiency anemia    Irregular menses    PVC (premature ventricular contraction)     Patient Active Problem List   Diagnosis Date Noted   Fatigue 10/02/2021   Acute nonintractable headache  10/02/2021   Abnormal uterine bleeding (AUB) 10/02/2021   Allergic rhinitis 05/06/2021   Candidal intertrigo 10/29/2020   Encounter for general adult medical examination with abnormal findings 10/29/2020   Hx of adenomatous polyp of colon 06/04/2020   PVC's (premature ventricular contractions) 04/19/2020   Hyperlipidemia 01/19/2020   Palpitation 12/29/2019   Fibroids 06/21/2011   Hypothyroidism 06/21/2011    Past Surgical History:  Procedure Laterality Date   CESAREAN SECTION     COLONOSCOPY     DILATION AND CURETTAGE OF UTERUS     ESOPHAGOGASTRODUODENOSCOPY  2008   HYSTEROSCOPY  03/31/2004   with resection of endometrial fibroid   WISDOM TOOTH EXTRACTION      OB History   No obstetric history on file.      Home Medications    Prior to Admission medications   Medication Sig Start Date End Date Taking? Authorizing Provider  predniSONE  (STERAPRED UNI-PAK 21 TAB) 10 MG (21) TBPK tablet TAKE AS DIRECTED ON PACKAGE FOR 6 DAYS. 06/09/23  Yes Jaeliana Lococo K, PA-C  cholecalciferol (VITAMIN D ) 25 MCG (1000 UNIT) tablet Take 2,000 Units by mouth daily. 12/17/20   [provider]  Dapsone  5 % topical gel Apply as directed to skin twice a day 02/10/21     ferrous sulfate 325 (  65 FE) MG tablet Take 325 mg by mouth 3 (three) times a week.    [provider]  Fluocinolone  Acetonide Scalp (DERMA-SMOOTHE /FS SCALP) 0.01 % OIL Apply 1 ml on the affected area of the skin as needed 07/06/22   Adelia Homestead, MD  fluticasone  (FLONASE ) 50 MCG/ACT nasal spray Place 2 sprays into both nostrils daily. 07/06/22   Adelia Homestead, MD  ibuprofen  (ADVIL ) 800 MG tablet Take 1 tablet (800 mg total) by mouth every 8 (eight) hours as needed (pain). 11/10/21   Banister, Pamela K, MD  levothyroxine  (SYNTHROID ) 112 MCG tablet Take 1 tablet (112 mcg total) by mouth daily before breakfast. 07/06/22   Adelia Homestead, MD  tiZANidine  (ZANAFLEX ) 4 MG tablet Take 1 tablet (4 mg total)  by mouth every 8 (eight) hours as needed for muscle spasms. 11/10/21   Banister, Pamela K, MD  triamcinolone  cream (KENALOG ) 0.1 % Apply topically 2 times daily. 05/04/22   Adelia Homestead, MD  vitamin B-12 (CYANOCOBALAMIN ) 1000 MCG tablet Take 1,000 mcg by mouth daily. Patient not taking: Reported on 01/26/2023    [provider]    Family History Family History  Problem Relation Age of Onset   Stroke Mother    Hiatal hernia Mother    Colon polyps Mother    Heart disease Father    Ulcers Father    Breast cancer Maternal Aunt    Ovarian cancer Sister    Colon cancer Neg Hx     Social History Social History   Tobacco Use   Smoking status: Never   Smokeless tobacco: Never  Vaping Use   Vaping status: Never Used  Substance Use Topics   Alcohol use: No   Drug use: No     Allergies   Doxycycline, E-mycin [erythromycin base], Septra [bactrim], Sulfa antibiotics, and Sulfamethoxazole-trimethoprim   Review of Systems Review of Systems  Constitutional:  Positive for activity change. Negative for appetite change, fatigue and fever.  HENT:  Positive for congestion and sore throat. Negative for sinus pressure and sneezing.   Eyes:  Negative for visual disturbance.  Respiratory:  Negative for cough and shortness of breath.   Cardiovascular:  Negative for chest pain.  Gastrointestinal:  Negative for abdominal pain, diarrhea, nausea and vomiting.  Neurological:  Positive for headaches. Negative for dizziness and light-headedness.     Physical Exam Triage Vital Signs ED Triage Vitals  Encounter Vitals Group     BP 06/09/23 1014 122/76     Systolic BP Percentile --      Diastolic BP Percentile --      Pulse Rate 06/09/23 1014 78     Resp 06/09/23 1014 16     Temp 06/09/23 1014 98.5 F (36.9 C)     Temp Source 06/09/23 1014 Oral     SpO2 06/09/23 1014 98 %     Weight --      Height --      Head Circumference --      Peak Flow --      Pain Score 06/09/23  1015 5     Pain Loc --      Pain Education --      Exclude from Growth Chart --    No data found.  Updated Vital Signs BP 122/76 (BP Location: Right Arm)   Pulse 78   Temp 98.5 F (36.9 C) (Oral)   Resp 16   LMP 05/04/2023 (Approximate)   SpO2 98%   Visual Acuity  Right Eye Distance: 20/20 Left Eye Distance: 20/20 Bilateral Distance:    Right Eye Near:   Left Eye Near:    Bilateral Near:     Physical Exam Vitals reviewed.  Constitutional:      General: She is awake. She is not in acute distress.    Appearance: Normal appearance. She is well-developed. She is not ill-appearing.     Comments: Very pleasant female appears stated age in no acute distress sitting comfortably in exam room  HENT:     Head: Normocephalic and atraumatic.     Jaw: Tenderness present. No swelling.     Comments: Mild tenderness palpation over left TMJ joint.  No crepitus noted.      Right Ear: Tympanic membrane, ear canal and external ear normal. Tympanic membrane is not erythematous or bulging.     Left Ear: Tympanic membrane, ear canal and external ear normal. Tympanic membrane is not erythematous or bulging.     Nose:     Right Sinus: No maxillary sinus tenderness or frontal sinus tenderness.     Left Sinus: No maxillary sinus tenderness or frontal sinus tenderness.     Mouth/Throat:     Pharynx: Uvula midline. Posterior oropharyngeal erythema present. No oropharyngeal exudate.     Tonsils: No tonsillar exudate or tonsillar abscesses. 1+ on the right. 1+ on the left.  Cardiovascular:     Rate and Rhythm: Normal rate and regular rhythm.     Heart sounds: Normal heart sounds, S1 normal and S2 normal. No murmur heard. Pulmonary:     Effort: Pulmonary effort is normal.     Breath sounds: Normal breath sounds. No wheezing, rhonchi or rales.     Comments: Clear to auscultation bilaterally Psychiatric:        Behavior: Behavior is cooperative.      UC Treatments / Results  Labs (all labs  ordered are listed, but only abnormal results are displayed) Labs Reviewed  POC COVID19/FLU A&B COMBO - Normal  CULTURE, GROUP A STREP ([facility])  CBC WITH DIFFERENTIAL/PLATELET  BASIC METABOLIC PANEL WITH GFR  SEDIMENTATION RATE  C-REACTIVE PROTEIN  POCT RAPID STREP A (OFFICE)    EKG   Radiology No results found.  Procedures Procedures (including critical care time)  Medications Ordered in UC Medications - No data to display  Initial Impression / Assessment and Plan / UC Course  I have reviewed the triage vital signs and the nursing notes.  Pertinent labs & imaging results that were available during my care of the patient were reviewed by me and considered in my medical decision making (see chart for details).     Patient is well-appearing, afebrile, nontoxic, nontachycardic and able to handle her own secretions.  No indication for emergent evaluation.  She was initially tested for strep and this was negative.  Will send this for culture but defer antibiotics until culture results are available.  No evidence of acute infection on physical exam that warrant initiation of antibiotics.  We discussed that with the associated congestion it is possible she has a viral illness and so she was tested for flu and COVID but these were negative.  Discussed it is still possible that she has a viral illness versus seasonal allergies contributing to her symptoms.  She did report a pulsing sensation over her left temple with associated pain and tenderness to palpation.  Her vision was normal she denies any visual disturbance.  Low suspicion for temporal arteritis but recent blood work was obtained including  CBC, CMP, inflammatory studies.  If these are elevated we will refer her to a vascular provider for further evaluation and management.  We will start prednisone  as this would help manage her symptoms we will also treating temporal arteritis.  Prednisone  taper was sent to pharmacy we discussed that she  is not to take NSAIDs with this medication.  She can use Mucinex and nasal saline to help manage symptoms.  Recommend close follow-up with her primary care.  We discussed that if anything worsens and she has increasing temporal headache, visual disturbance, fever, nausea, vomiting, dysphagia, muffled voice she needs to be seen emergently.  Strict return precautions given.  Work excuse note provided.  Final Clinical Impressions(s) / UC Diagnoses   Final diagnoses:  Viral pharyngitis  Right sided temporal headache     Discharge Instructions      You are negative for strep, COVID, flu.  I am sending your throat culture off and we will contact you if this grows any bacteria we need to start antibiotic.  Given all of your symptoms, I would like to start prednisone  to help with the pain and inflammation.  Do not take NSAIDs with this medication including aspirin, ibuprofen /Advil , naproxen/Aleve.  Continue with nasal saline and sinus rinses as well as Mucinex.  As we discussed, sometimes headache over your temple can be concerning for inflammation of a blood vessel.  The prednisone  should help with this but if any of your blood work is abnormal we will have to arrange follow-up with a vascular provider.  I will contact you if any of this is abnormal.  If anything changes and you have a severe headache, fever, vision change, nausea, vomiting, difficulty swallowing, swelling of your throat you need to go to the emergency room immediately.  Please follow-up with your primary care soon as possible.     ED Prescriptions     Medication Sig Dispense Auth. Provider   predniSONE  (STERAPRED UNI-PAK 21 TAB) 10 MG (21) TBPK tablet TAKE AS DIRECTED ON PACKAGE FOR 6 DAYS. 21 tablet Bela Bonaparte K, PA-C      PDMP not reviewed this encounter.   Budd Cargo, PA-C 06/09/23 1207

## 2023-06-09 NOTE — ED Triage Notes (Signed)
 Pt reports sore throat and HA for x 2 days, denies any fever or chills. States" I feels my heart beat on my  ears."

## 2023-06-10 ENCOUNTER — Encounter: Payer: Self-pay | Admitting: Physician Assistant

## 2023-06-12 LAB — CULTURE, GROUP A STREP (THRC)

## 2023-06-14 ENCOUNTER — Encounter: Payer: Self-pay | Admitting: Internal Medicine

## 2023-06-14 LAB — HM MAMMOGRAPHY

## 2023-06-20 ENCOUNTER — Encounter (HOSPITAL_COMMUNITY): Payer: Self-pay

## 2023-06-20 ENCOUNTER — Ambulatory Visit (HOSPITAL_COMMUNITY)
Admission: EM | Admit: 2023-06-20 | Discharge: 2023-06-20 | Disposition: A | Discharge status: Home / Self Care | Attending: Emergency Medicine | Admitting: Emergency Medicine

## 2023-06-20 ENCOUNTER — Other Ambulatory Visit (HOSPITAL_COMMUNITY): Payer: Self-pay

## 2023-06-20 DIAGNOSIS — H6591 Unspecified nonsuppurative otitis media, right ear: Secondary | ICD-10-CM

## 2023-06-20 DIAGNOSIS — H66002 Acute suppurative otitis media without spontaneous rupture of ear drum, left ear: Secondary | ICD-10-CM

## 2023-06-20 MED ORDER — AMOXICILLIN-POT CLAVULANATE 875-125 MG PO TABS
1.0000 | ORAL_TABLET | Freq: Two times a day (BID) | ORAL | 0 refills | Status: DC
  Filled 2023-06-20: qty 14, 7d supply, fill #0

## 2023-06-20 NOTE — Discharge Instructions (Signed)
 Start taking Augmentin  twice daily for 7 days for ear infection. Continue to use Flonase  nasal spray to help with congestion and fluid to right eardrum. Also recommend taking Zyrtec or Claritin once daily to help with this.  You can take this at night if it makes you drowsy. Alternate between 650 mg of Tylenol and 400 mg of Advil  every 6-8 hours as needed for pain. Follow-up with your primary care provider or return here as needed.

## 2023-06-20 NOTE — ED Triage Notes (Signed)
 Patient was seen on 06/09/2023 for headache and ear pain. Patient was prescribed prednisolone. Patient states she is not any better.

## 2023-06-20 NOTE — ED Provider Notes (Signed)
 [facility]    CSN: [id number] Arrival date & time: 06/20/23  1308      History   Chief Complaint Chief Complaint  Patient presents with   Ear Pain    HPI Sally Mitchell is a 52 y.o. female.   Patient presents with persistent left-sided headache as well as left sided ear pain after being seen on 4/19 and given prednisone  at that time.  Patient states that she had some relief with the prednisone , but still continued to have pain which then became worse after finishing the prednisone .   Patient states that she also has some congestion and mild sore throat. Patient states that she has also been taking Advil  with minimal relief.  Patient states that she has been taking Mucinex and using Flonase  nasal spray with minimal relief as well.  Denies fever, blurred vision, difficulty hearing, dizziness, chest pain, shortness of breath, weakness, confusion, and slurred speech.     Past Medical History:  Diagnosis Date   Acne vulgaris    Anemia    Eczema    seborrhecic dermatitis   FH: breast cancer    Fibroids, submucosal    GERD (gastroesophageal reflux disease)    H/O dysmenorrhea    H/O fatigue    H/O hematuria    H/O menorrhagia    Hair loss    Hiatal hernia    History of kidney stones    Hx of adenomatous polyp of colon 06/04/2020   Hypercholesterolemia    mild   Hypopotassemia    Hypothyroidism    Insomnia    Iron deficiency anemia    Irregular menses    PVC (premature ventricular contraction)     Patient Active Problem List   Diagnosis Date Noted   Fatigue 10/02/2021   Acute nonintractable headache 10/02/2021   Abnormal uterine bleeding (AUB) 10/02/2021   Allergic rhinitis 05/06/2021   Candidal intertrigo 10/29/2020   Encounter for general adult medical examination with abnormal findings 10/29/2020   Hx of adenomatous polyp of colon 06/04/2020   PVC's (premature ventricular contractions) 04/19/2020   Hyperlipidemia 01/19/2020   Palpitation  12/29/2019   Fibroids 06/21/2011   Hypothyroidism 06/21/2011    Past Surgical History:  Procedure Laterality Date   CESAREAN SECTION     COLONOSCOPY     DILATION AND CURETTAGE OF UTERUS     ESOPHAGOGASTRODUODENOSCOPY  2008   HYSTEROSCOPY  03/31/2004   with resection of endometrial fibroid   WISDOM TOOTH EXTRACTION      OB History   No obstetric history on file.      Home Medications    Prior to Admission medications   Medication Sig Start Date End Date Taking? Authorizing Provider  amoxicillin -clavulanate (AUGMENTIN ) 875-125 MG tablet Take 1 tablet by mouth every 12 (twelve) hours. 06/20/23  Yes Rosevelt Constable, Keelyn Monjaras A, NP  cholecalciferol (VITAMIN D ) 25 MCG (1000 UNIT) tablet Take 2,000 Units by mouth daily. 12/17/20  Yes [provider]  Dapsone  5 % topical gel Apply as directed to skin twice a day 02/10/21  Yes   ferrous sulfate 325 (65 FE) MG tablet Take 325 mg by mouth 3 (three) times a week.   Yes [provider]  Fluocinolone  Acetonide Scalp (DERMA-SMOOTHE /FS SCALP) 0.01 % OIL Apply 1 ml on the affected area of the skin as needed 07/06/22  Yes Adelia Homestead, MD  fluticasone  (FLONASE ) 50 MCG/ACT nasal spray Place 2 sprays into both nostrils daily. 07/06/22  Yes Adelia Homestead, MD  ibuprofen  (  ADVIL ) 800 MG tablet Take 1 tablet (800 mg total) by mouth every 8 (eight) hours as needed (pain). 11/10/21  Yes Ann Keto, MD  levothyroxine  (SYNTHROID ) 112 MCG tablet Take 1 tablet (112 mcg total) by mouth daily before breakfast. 07/06/22  Yes Adelia Homestead, MD  tiZANidine  (ZANAFLEX ) 4 MG tablet Take 1 tablet (4 mg total) by mouth every 8 (eight) hours as needed for muscle spasms. 11/10/21  Yes Ann Keto, MD  triamcinolone  cream (KENALOG ) 0.1 % Apply topically 2 times daily. 05/04/22  Yes Adelia Homestead, MD  vitamin B-12 (CYANOCOBALAMIN ) 1000 MCG tablet Take 1,000 mcg by mouth daily.   Yes [provider]    Family  History Family History  Problem Relation Age of Onset   Stroke Mother    Hiatal hernia Mother    Colon polyps Mother    Heart disease Father    Ulcers Father    Breast cancer Maternal Aunt    Ovarian cancer Sister    Colon cancer Neg Hx     Social History Social History   Tobacco Use   Smoking status: Never   Smokeless tobacco: Never  Vaping Use   Vaping status: Never Used  Substance Use Topics   Alcohol use: No   Drug use: No     Allergies   Doxycycline, E-mycin [erythromycin base], Septra [bactrim], Sulfa antibiotics, and Sulfamethoxazole-trimethoprim   Review of Systems Review of Systems  Per HPI  Physical Exam Triage Vital Signs ED Triage Vitals [06/20/23 1435]  Encounter Vitals Group     BP (!) 110/57     Systolic BP Percentile      Diastolic BP Percentile      Pulse Rate 71     Resp 16     Temp 98.1 F (36.7 C)     Temp Source Oral     SpO2 98 %     Weight      Height      Head Circumference      Peak Flow      Pain Score      Pain Loc      Pain Education      Exclude from Growth Chart    No data found.  Updated Vital Signs BP (!) 110/57 (BP Location: Left Arm)   Pulse 71   Temp 98.1 F (36.7 C) (Oral)   Resp 16   LMP 06/10/2023 (Approximate)   SpO2 98%   Visual Acuity Right Eye Distance:   Left Eye Distance:   Bilateral Distance:    Right Eye Near:   Left Eye Near:    Bilateral Near:     Physical Exam Vitals and nursing note reviewed.  Constitutional:      General: She is awake. She is not in acute distress.    Appearance: Normal appearance. She is well-developed and well-groomed. She is not ill-appearing.  HENT:     Head: Normocephalic.     Right Ear: Ear canal and external ear normal. A middle ear effusion is present. Tympanic membrane is not erythematous.     Left Ear: Ear canal and external ear normal. Tympanic membrane is erythematous and bulging.     Nose: Congestion present.     Mouth/Throat:     Mouth: Mucous  membranes are moist.     Pharynx: Posterior oropharyngeal erythema present. No oropharyngeal exudate.  Skin:    General: Skin is warm and dry.  Neurological:     Mental Status: She  is alert.  Psychiatric:        Behavior: Behavior is cooperative.      UC Treatments / Results  Labs (all labs ordered are listed, but only abnormal results are displayed) Labs Reviewed - No data to display  EKG   Radiology No results found.  Procedures Procedures (including critical care time)  Medications Ordered in UC Medications - No data to display  Initial Impression / Assessment and Plan / UC Course  I have reviewed the triage vital signs and the nursing notes.  Pertinent labs & imaging results that were available during my care of the patient were reviewed by me and considered in my medical decision making (see chart for details).     Patient is well-appearing.  Vitals are stable.  Upon assessment left TM is erythematous and bulging, and there is middle ear effusion noted to right TM.  Mild congestion present, mild erythema noted to pharynx.  Prescribed Augmentin  for otitis media.  Recommended Flonase  and daily antihistamine to help with middle ear effusion.  Discussed follow-up and return precautions. Final Clinical Impressions(s) / UC Diagnoses   Final diagnoses:  Non-recurrent acute suppurative otitis media of left ear without spontaneous rupture of tympanic membrane  Fluid level behind tympanic membrane of right ear     Discharge Instructions      Start taking Augmentin  twice daily for 7 days for ear infection. Continue to use Flonase  nasal spray to help with congestion and fluid to right eardrum. Also recommend taking Zyrtec or Claritin once daily to help with this.  You can take this at night if it makes you drowsy. Alternate between 650 mg of Tylenol and 400 mg of Advil  every 6-8 hours as needed for pain. Follow-up with your primary care provider or return here as  needed.    ED Prescriptions     Medication Sig Dispense Auth. Provider   amoxicillin -clavulanate (AUGMENTIN ) 875-125 MG tablet Take 1 tablet by mouth every 12 (twelve) hours. 14 tablet Levora Reas A, NP      PDMP not reviewed this encounter.   Levora Reas A, NP 06/20/23 [phone]

## 2023-08-03 ENCOUNTER — Other Ambulatory Visit (HOSPITAL_COMMUNITY): Payer: Self-pay

## 2023-08-03 ENCOUNTER — Other Ambulatory Visit: Payer: Self-pay | Admitting: Internal Medicine

## 2023-08-09 ENCOUNTER — Other Ambulatory Visit (HOSPITAL_COMMUNITY): Payer: Self-pay

## 2023-08-09 DIAGNOSIS — D219 Benign neoplasm of connective and other soft tissue, unspecified: Secondary | ICD-10-CM | POA: Diagnosis not present

## 2023-08-09 DIAGNOSIS — B9689 Other specified bacterial agents as the cause of diseases classified elsewhere: Secondary | ICD-10-CM | POA: Diagnosis not present

## 2023-08-09 DIAGNOSIS — N920 Excessive and frequent menstruation with regular cycle: Secondary | ICD-10-CM | POA: Diagnosis not present

## 2023-08-09 DIAGNOSIS — Z01419 Encounter for gynecological examination (general) (routine) without abnormal findings: Secondary | ICD-10-CM | POA: Diagnosis not present

## 2023-08-09 DIAGNOSIS — N898 Other specified noninflammatory disorders of vagina: Secondary | ICD-10-CM | POA: Diagnosis not present

## 2023-08-09 DIAGNOSIS — N760 Acute vaginitis: Secondary | ICD-10-CM | POA: Diagnosis not present

## 2023-08-09 DIAGNOSIS — Z130 Encounter for screening for diseases of the blood and blood-forming organs and certain disorders involving the immune mechanism: Secondary | ICD-10-CM | POA: Diagnosis not present

## 2023-08-09 DIAGNOSIS — R5383 Other fatigue: Secondary | ICD-10-CM | POA: Diagnosis not present

## 2023-08-09 DIAGNOSIS — Z1389 Encounter for screening for other disorder: Secondary | ICD-10-CM | POA: Diagnosis not present

## 2023-08-09 DIAGNOSIS — Z01411 Encounter for gynecological examination (general) (routine) with abnormal findings: Secondary | ICD-10-CM | POA: Diagnosis not present

## 2023-08-09 MED ORDER — METRONIDAZOLE 500 MG PO TABS
ORAL_TABLET | ORAL | 0 refills | Status: DC
  Filled 2023-08-09: qty 14, 7d supply, fill #0

## 2023-08-09 MED ORDER — FLUCONAZOLE 150 MG PO TABS
ORAL_TABLET | ORAL | 0 refills | Status: DC
  Filled 2023-08-09: qty 3, 9d supply, fill #0

## 2023-08-10 ENCOUNTER — Encounter: Payer: Self-pay | Admitting: Obstetrics and Gynecology

## 2023-08-10 ENCOUNTER — Other Ambulatory Visit: Payer: Self-pay | Admitting: Obstetrics and Gynecology

## 2023-08-10 DIAGNOSIS — D219 Benign neoplasm of connective and other soft tissue, unspecified: Secondary | ICD-10-CM

## 2023-08-15 ENCOUNTER — Other Ambulatory Visit (HOSPITAL_COMMUNITY): Payer: Self-pay

## 2023-08-15 ENCOUNTER — Telehealth: Payer: Self-pay | Admitting: Internal Medicine

## 2023-08-15 DIAGNOSIS — E039 Hypothyroidism, unspecified: Secondary | ICD-10-CM

## 2023-08-15 MED ORDER — FLUTICASONE PROPIONATE 50 MCG/ACT NA SUSP
2.0000 | Freq: Every day | NASAL | 6 refills | Status: DC
  Filled 2023-08-15: qty 16, 30d supply, fill #0

## 2023-08-17 ENCOUNTER — Other Ambulatory Visit (HOSPITAL_COMMUNITY): Payer: Self-pay

## 2023-08-17 MED ORDER — LEVOTHYROXINE SODIUM 112 MCG PO TABS
112.0000 ug | ORAL_TABLET | Freq: Every day | ORAL | 1 refills | Status: DC
  Filled 2023-08-17: qty 90, 90d supply, fill #0

## 2023-08-17 NOTE — Telephone Encounter (Signed)
 Copied from CRM [id number]. Topic: Clinical - Medication Question >> Aug 17, 2023 12:04 PM Sally Mitchell wrote: Reason for CRM: Pt calling in to check on the status of her levothyroxine  (SYNTHROID ) 112 MCG tablet requested on 6/25. States she is almost out of it. I let her know that med refills can take up to 3 business days to be sent over.  Pt callback if needed is [phone]

## 2023-08-17 NOTE — Telephone Encounter (Signed)
This was sent in today.  

## 2023-08-21 ENCOUNTER — Ambulatory Visit
Admission: RE | Admit: 2023-08-21 | Discharge: 2023-08-21 | Disposition: A | Discharge status: Home / Self Care | Source: Ambulatory Visit | Attending: Obstetrics and Gynecology

## 2023-08-21 ENCOUNTER — Ambulatory Visit
Admission: RE | Admit: 2023-08-21 | Discharge: 2023-08-21 | Disposition: A | Discharge status: Home / Self Care | Source: Ambulatory Visit | Attending: Obstetrics and Gynecology | Admitting: Obstetrics and Gynecology

## 2023-08-21 DIAGNOSIS — D219 Benign neoplasm of connective and other soft tissue, unspecified: Secondary | ICD-10-CM

## 2023-08-22 ENCOUNTER — Other Ambulatory Visit (HOSPITAL_COMMUNITY): Payer: Self-pay

## 2023-09-13 ENCOUNTER — Ambulatory Visit: Admission: RE | Admit: 2023-09-13 | Source: Ambulatory Visit

## 2023-10-07 ENCOUNTER — Ambulatory Visit
Admission: RE | Admit: 2023-10-07 | Discharge: 2023-10-07 | Disposition: A | Discharge status: Home / Self Care | Source: Ambulatory Visit | Attending: Obstetrics and Gynecology

## 2023-10-07 ENCOUNTER — Ambulatory Visit
Admission: RE | Admit: 2023-10-07 | Discharge: 2023-10-07 | Disposition: A | Discharge status: Home / Self Care | Source: Ambulatory Visit | Attending: Obstetrics and Gynecology | Admitting: Obstetrics and Gynecology

## 2023-10-07 DIAGNOSIS — D251 Intramural leiomyoma of uterus: Secondary | ICD-10-CM | POA: Diagnosis not present

## 2023-10-07 DIAGNOSIS — K8020 Calculus of gallbladder without cholecystitis without obstruction: Secondary | ICD-10-CM | POA: Diagnosis not present

## 2023-10-07 MED ORDER — GADOPICLENOL 0.5 MMOL/ML IV SOLN
7.0000 mL | Freq: Once | INTRAVENOUS | Status: AC | PRN
  Administered 2023-10-07: 7 mL via INTRAVENOUS

## 2023-11-16 ENCOUNTER — Ambulatory Visit: Admitting: Internal Medicine

## 2023-11-16 ENCOUNTER — Encounter: Payer: Self-pay | Admitting: Internal Medicine

## 2023-11-16 ENCOUNTER — Other Ambulatory Visit (HOSPITAL_COMMUNITY): Payer: Self-pay

## 2023-11-16 VITALS — BP 114/68 | HR 103 | Temp 98.5°F | Ht 63.0 in | Wt 165.0 lb

## 2023-11-16 DIAGNOSIS — E785 Hyperlipidemia, unspecified: Secondary | ICD-10-CM

## 2023-11-16 DIAGNOSIS — R238 Other skin changes: Secondary | ICD-10-CM

## 2023-11-16 DIAGNOSIS — Z0001 Encounter for general adult medical examination with abnormal findings: Secondary | ICD-10-CM

## 2023-11-16 DIAGNOSIS — I493 Ventricular premature depolarization: Secondary | ICD-10-CM | POA: Diagnosis not present

## 2023-11-16 DIAGNOSIS — N939 Abnormal uterine and vaginal bleeding, unspecified: Secondary | ICD-10-CM | POA: Diagnosis not present

## 2023-11-16 DIAGNOSIS — E039 Hypothyroidism, unspecified: Secondary | ICD-10-CM

## 2023-11-16 DIAGNOSIS — D219 Benign neoplasm of connective and other soft tissue, unspecified: Secondary | ICD-10-CM

## 2023-11-16 LAB — LIPID PANEL
Cholesterol: 251 mg/dL — ABNORMAL HIGH (ref 0–200)
HDL: 67.2 mg/dL (ref >39.00)
LDL Cholesterol: 172 mg/dL — ABNORMAL HIGH (ref 0–99)
NonHDL: 183.86
Total CHOL/HDL Ratio: 4
Triglycerides: 57 mg/dL (ref 0.0–149.0)
VLDL: 11.4 mg/dL (ref 0.0–40.0)

## 2023-11-16 LAB — CBC
HCT: 44.5 % (ref 36.0–46.0)
Hemoglobin: 14.9 g/dL (ref 12.0–15.0)
MCHC: 33.6 g/dL (ref 30.0–36.0)
MCV: 91.3 fl (ref 78.0–100.0)
Platelets: 185 K/uL (ref 150.0–400.0)
RBC: 4.87 Mil/uL (ref 3.87–5.11)
RDW: 15.4 % (ref 11.5–15.5)
WBC: 3.1 K/uL — ABNORMAL LOW (ref 4.0–10.5)

## 2023-11-16 LAB — COMPREHENSIVE METABOLIC PANEL WITH GFR
ALT: 11 U/L (ref 0–35)
AST: 15 U/L (ref 0–37)
Albumin: 4.8 g/dL (ref 3.5–5.2)
Alkaline Phosphatase: 58 U/L (ref 39–117)
BUN: 15 mg/dL (ref 6–23)
CO2: 29 meq/L (ref 19–32)
Calcium: 9.9 mg/dL (ref 8.4–10.5)
Chloride: 103 meq/L (ref 96–112)
Creatinine, Ser: 0.74 mg/dL (ref 0.40–1.20)
GFR: 92.2 mL/min (ref >60.00)
Glucose, Bld: 80 mg/dL (ref 70–99)
Potassium: 3.7 meq/L (ref 3.5–5.1)
Sodium: 139 meq/L (ref 135–145)
Total Bilirubin: 1 mg/dL (ref 0.2–1.2)
Total Protein: 8.2 g/dL (ref 6.0–8.3)

## 2023-11-16 LAB — VITAMIN B12: Vitamin B-12: 437 pg/mL (ref 211–911)

## 2023-11-16 LAB — TSH: TSH: 3.09 u[IU]/mL (ref 0.35–5.50)

## 2023-11-16 LAB — FERRITIN: Ferritin: 43.6 ng/mL (ref 10.0–291.0)

## 2023-11-16 MED ORDER — TRIAMCINOLONE ACETONIDE 0.1 % EX CREA
1.0000 | TOPICAL_CREAM | Freq: Two times a day (BID) | CUTANEOUS | 6 refills | Status: AC
  Filled 2023-11-16: qty 80, 30d supply, fill #0

## 2023-11-16 MED ORDER — FLUTICASONE PROPIONATE 50 MCG/ACT NA SUSP
2.0000 | Freq: Every day | NASAL | 3 refills | Status: AC
  Filled 2023-11-16: qty 48, 90d supply, fill #0

## 2023-11-16 MED ORDER — FLUOCINOLONE ACETONIDE SCALP 0.01 % EX OIL
TOPICAL_OIL | CUTANEOUS | 11 refills | Status: AC
  Filled 2023-11-16: qty 118.28, 30d supply, fill #0

## 2023-11-16 MED ORDER — LEVOTHYROXINE SODIUM 112 MCG PO TABS
112.0000 ug | ORAL_TABLET | Freq: Every day | ORAL | 3 refills | Status: AC
  Filled 2023-11-16: qty 90, 90d supply, fill #0
  Filled 2024-02-20: qty 90, 90d supply, fill #1

## 2023-11-16 NOTE — Assessment & Plan Note (Signed)
 Stable and we talked about triggers.

## 2023-11-16 NOTE — Assessment & Plan Note (Signed)
 Improving with no period since July seeing OB/gyn with known fibroids.

## 2023-11-16 NOTE — Assessment & Plan Note (Signed)
 Checking CBC and ferritin as Hg low with ob/gyn recently. She is now taking iron otc 3 times a week.

## 2023-11-16 NOTE — Progress Notes (Signed)
   Subjective:   Patient ID: Sally Mitchell, female    DOB: September 12, 1970, 53 y.o.   MRN: [id number]  The patient is here for physical. Pertinent topics discussed: Discussed the use of AI scribe software for clinical note transcription with the patient, who gave verbal consent to proceed.  History of Present Illness Sally Mitchell Sally Mitchell is a 53 year old female who presents for evaluation of anemia and scalp issues.  She takes iron supplements three times a week for low hemoglobin levels. She experiences constipation, likely related to iron supplementation, and manages it by increasing her fiber intake. She is also incorporating more iron-rich foods into her diet.  She has concerns about scalp issues, including dryness, irritation, hair loss, and scalp pain. She previously used Derma Smooth scalp oil. She also reports dry patches on her face and behind her ears, which she associates with her scalp condition. She uses triamcinolone  cream for eczema-like patches on her neck and other areas, which she manages with Eucerin cream but sometimes requires additional treatment. She has noticed some new skin spots or moles.  She has a history of fibroids and has undergone an MRI for evaluation. She reports some pain associated with her fibroids.  She has stopped taking B12 supplements and is unsure if she should resume them.  PMH, [facility], social history reviewed and updated  Review of Systems  Constitutional: Negative.   HENT: Negative.    Eyes: Negative.   Respiratory:  Negative for cough, chest tightness and shortness of breath.   Cardiovascular:  Positive for palpitations. Negative for chest pain and leg swelling.       Stable  Gastrointestinal:  Negative for abdominal distention, abdominal pain, constipation, diarrhea, nausea and vomiting.  Musculoskeletal: Negative.   Skin: Negative.   Neurological: Negative.   Psychiatric/Behavioral: Negative.      Objective:  Physical  Exam Constitutional:      Appearance: She is well-developed.  HENT:     Head: Normocephalic and atraumatic.  Cardiovascular:     Rate and Rhythm: Normal rate and regular rhythm.  Pulmonary:     Effort: Pulmonary effort is normal. No respiratory distress.     Breath sounds: Normal breath sounds. No wheezing or rales.  Abdominal:     General: Bowel sounds are normal. There is no distension.     Palpations: Abdomen is soft.     Tenderness: There is no abdominal tenderness.  Musculoskeletal:     Cervical back: Normal range of motion.  Skin:    General: Skin is warm and dry.  Neurological:     Mental Status: She is alert and oriented to person, place, and time.     Coordination: Coordination normal.     Vitals:   11/16/23 0803  BP: 114/68  Pulse: (!) 103  Temp: 98.5 F (36.9 C)  TempSrc: Oral  SpO2: 97%  Weight: 165 lb (74.8 kg)  Height: 5' 3 (1.6 m)    Assessment & Plan:

## 2023-11-16 NOTE — Assessment & Plan Note (Signed)
 Refilled scalp flucinolone  and triamcinolone  for patch behind ear. Referral to dermatology per patient preference.

## 2023-11-16 NOTE — Assessment & Plan Note (Signed)
Checking TSH and adjust levothyroxine as needed.

## 2023-11-16 NOTE — Assessment & Plan Note (Signed)
 Checking lipid panel and adjust as needed.

## 2023-11-16 NOTE — Assessment & Plan Note (Signed)
 Flu shot yearly. Pneumonia counseled. Shingrix counseled. Tetanus counseled. Colonoscopy up to date. Mammogram up to date, pap smear up to date with gyn. Counseled about sun safety and mole surveillance. Counseled about the dangers of distracted driving. Given 10 year screening recommendations.

## 2023-11-19 ENCOUNTER — Ambulatory Visit: Payer: Self-pay | Admitting: Internal Medicine

## 2024-01-11 ENCOUNTER — Other Ambulatory Visit (HOSPITAL_COMMUNITY): Payer: Self-pay

## 2024-01-11 ENCOUNTER — Telehealth: Admitting: Family Medicine

## 2024-01-11 DIAGNOSIS — B9689 Other specified bacterial agents as the cause of diseases classified elsewhere: Secondary | ICD-10-CM

## 2024-01-11 DIAGNOSIS — J019 Acute sinusitis, unspecified: Secondary | ICD-10-CM | POA: Diagnosis not present

## 2024-01-11 MED ORDER — AMOXICILLIN-POT CLAVULANATE 875-125 MG PO TABS
1.0000 | ORAL_TABLET | Freq: Two times a day (BID) | ORAL | 0 refills | Status: AC
  Filled 2024-01-11: qty 20, 10d supply, fill #0

## 2024-01-11 NOTE — Progress Notes (Signed)

## 2024-01-12 ENCOUNTER — Other Ambulatory Visit (HOSPITAL_COMMUNITY): Payer: Self-pay

## 2024-02-20 ENCOUNTER — Other Ambulatory Visit (HOSPITAL_COMMUNITY): Payer: Self-pay

## 2024-06-25 ENCOUNTER — Ambulatory Visit: Admitting: Dermatology
